# Patient Record
Sex: Male | Born: 1980 | Race: White | Hispanic: No | Marital: Single | Smoking: Never smoker
Health system: Southern US, Community
[De-identification: ages and names within clinical notes are randomized; demographics above are authoritative.]

## PROBLEM LIST (undated history)

## (undated) DIAGNOSIS — I10 Essential (primary) hypertension: Secondary | ICD-10-CM

## (undated) DIAGNOSIS — N186 End stage renal disease: Secondary | ICD-10-CM

## (undated) DIAGNOSIS — E119 Type 2 diabetes mellitus without complications: Secondary | ICD-10-CM

## (undated) DIAGNOSIS — Z951 Presence of aortocoronary bypass graft: Secondary | ICD-10-CM

## (undated) DIAGNOSIS — I2510 Atherosclerotic heart disease of native coronary artery without angina pectoris: Secondary | ICD-10-CM

## (undated) DIAGNOSIS — K219 Gastro-esophageal reflux disease without esophagitis: Secondary | ICD-10-CM

## (undated) DIAGNOSIS — Z992 Dependence on renal dialysis: Secondary | ICD-10-CM

---

## 2004-10-27 ENCOUNTER — Ambulatory Visit (HOSPITAL_COMMUNITY): Admission: RE | Admit: 2004-10-27 | Discharge: 2004-10-28 | Payer: Self-pay | Admitting: Ophthalmology

## 2005-06-21 ENCOUNTER — Inpatient Hospital Stay (HOSPITAL_COMMUNITY): Admission: EM | Admit: 2005-06-21 | Discharge: 2005-06-23 | Payer: Self-pay | Admitting: Internal Medicine

## 2005-06-23 ENCOUNTER — Emergency Department (HOSPITAL_COMMUNITY): Admission: EM | Admit: 2005-06-23 | Discharge: 2005-06-24 | Payer: Self-pay | Admitting: Emergency Medicine

## 2005-09-11 ENCOUNTER — Inpatient Hospital Stay (HOSPITAL_COMMUNITY): Admission: EM | Admit: 2005-09-11 | Discharge: 2005-09-14 | Payer: Self-pay | Admitting: Emergency Medicine

## 2005-09-12 ENCOUNTER — Encounter (INDEPENDENT_AMBULATORY_CARE_PROVIDER_SITE_OTHER): Payer: Self-pay | Admitting: Neurology

## 2005-09-12 ENCOUNTER — Encounter (INDEPENDENT_AMBULATORY_CARE_PROVIDER_SITE_OTHER): Payer: Self-pay | Admitting: Interventional Cardiology

## 2006-02-14 ENCOUNTER — Ambulatory Visit: Admission: RE | Admit: 2006-02-14 | Discharge: 2006-02-14 | Payer: Self-pay | Admitting: Vascular Surgery

## 2006-03-29 DIAGNOSIS — Z951 Presence of aortocoronary bypass graft: Secondary | ICD-10-CM

## 2006-03-29 HISTORY — DX: Presence of aortocoronary bypass graft: Z95.1

## 2006-08-05 ENCOUNTER — Emergency Department (HOSPITAL_COMMUNITY): Admission: EM | Admit: 2006-08-05 | Discharge: 2006-08-05 | Payer: Self-pay | Admitting: Emergency Medicine

## 2006-11-15 ENCOUNTER — Inpatient Hospital Stay (HOSPITAL_COMMUNITY): Admission: EM | Admit: 2006-11-15 | Discharge: 2006-11-17 | Payer: Self-pay | Admitting: Emergency Medicine

## 2006-11-25 ENCOUNTER — Inpatient Hospital Stay (HOSPITAL_COMMUNITY): Admission: EM | Admit: 2006-11-25 | Discharge: 2006-11-27 | Payer: Self-pay | Admitting: Emergency Medicine

## 2007-01-10 ENCOUNTER — Inpatient Hospital Stay (HOSPITAL_COMMUNITY): Admission: EM | Admit: 2007-01-10 | Discharge: 2007-01-12 | Payer: Self-pay | Admitting: *Deleted

## 2007-02-27 ENCOUNTER — Inpatient Hospital Stay (HOSPITAL_COMMUNITY): Admission: EM | Admit: 2007-02-27 | Discharge: 2007-03-03 | Payer: Self-pay | Admitting: Emergency Medicine

## 2007-02-27 ENCOUNTER — Ambulatory Visit: Payer: Self-pay | Admitting: Internal Medicine

## 2007-03-24 ENCOUNTER — Emergency Department (HOSPITAL_COMMUNITY): Admission: EM | Admit: 2007-03-24 | Discharge: 2007-03-24 | Payer: Self-pay | Admitting: Emergency Medicine

## 2007-09-09 ENCOUNTER — Inpatient Hospital Stay (HOSPITAL_COMMUNITY): Admission: EM | Admit: 2007-09-09 | Discharge: 2007-09-12 | Payer: Self-pay | Admitting: Emergency Medicine

## 2009-04-05 ENCOUNTER — Ambulatory Visit (HOSPITAL_COMMUNITY): Admission: EM | Admit: 2009-04-05 | Discharge: 2009-04-05 | Payer: Self-pay | Admitting: Vascular Surgery

## 2009-04-05 ENCOUNTER — Ambulatory Visit: Payer: Self-pay | Admitting: Vascular Surgery

## 2009-04-29 ENCOUNTER — Ambulatory Visit: Payer: Self-pay | Admitting: Vascular Surgery

## 2009-05-13 ENCOUNTER — Ambulatory Visit: Payer: Self-pay | Admitting: Vascular Surgery

## 2009-05-13 ENCOUNTER — Ambulatory Visit (HOSPITAL_COMMUNITY): Admission: RE | Admit: 2009-05-13 | Discharge: 2009-05-13 | Payer: Self-pay | Admitting: Vascular Surgery

## 2009-06-05 ENCOUNTER — Ambulatory Visit: Payer: Self-pay | Admitting: Vascular Surgery

## 2009-06-16 IMAGING — CR DG CHEST 1V PORT
1 series · 1 of 1 positions shown · non-contrast
Comparison: 02/14/2006

CLINICAL DATA: Missed dialysis. Headaches.

CHEST - 1 VIEW

[view not recorded]
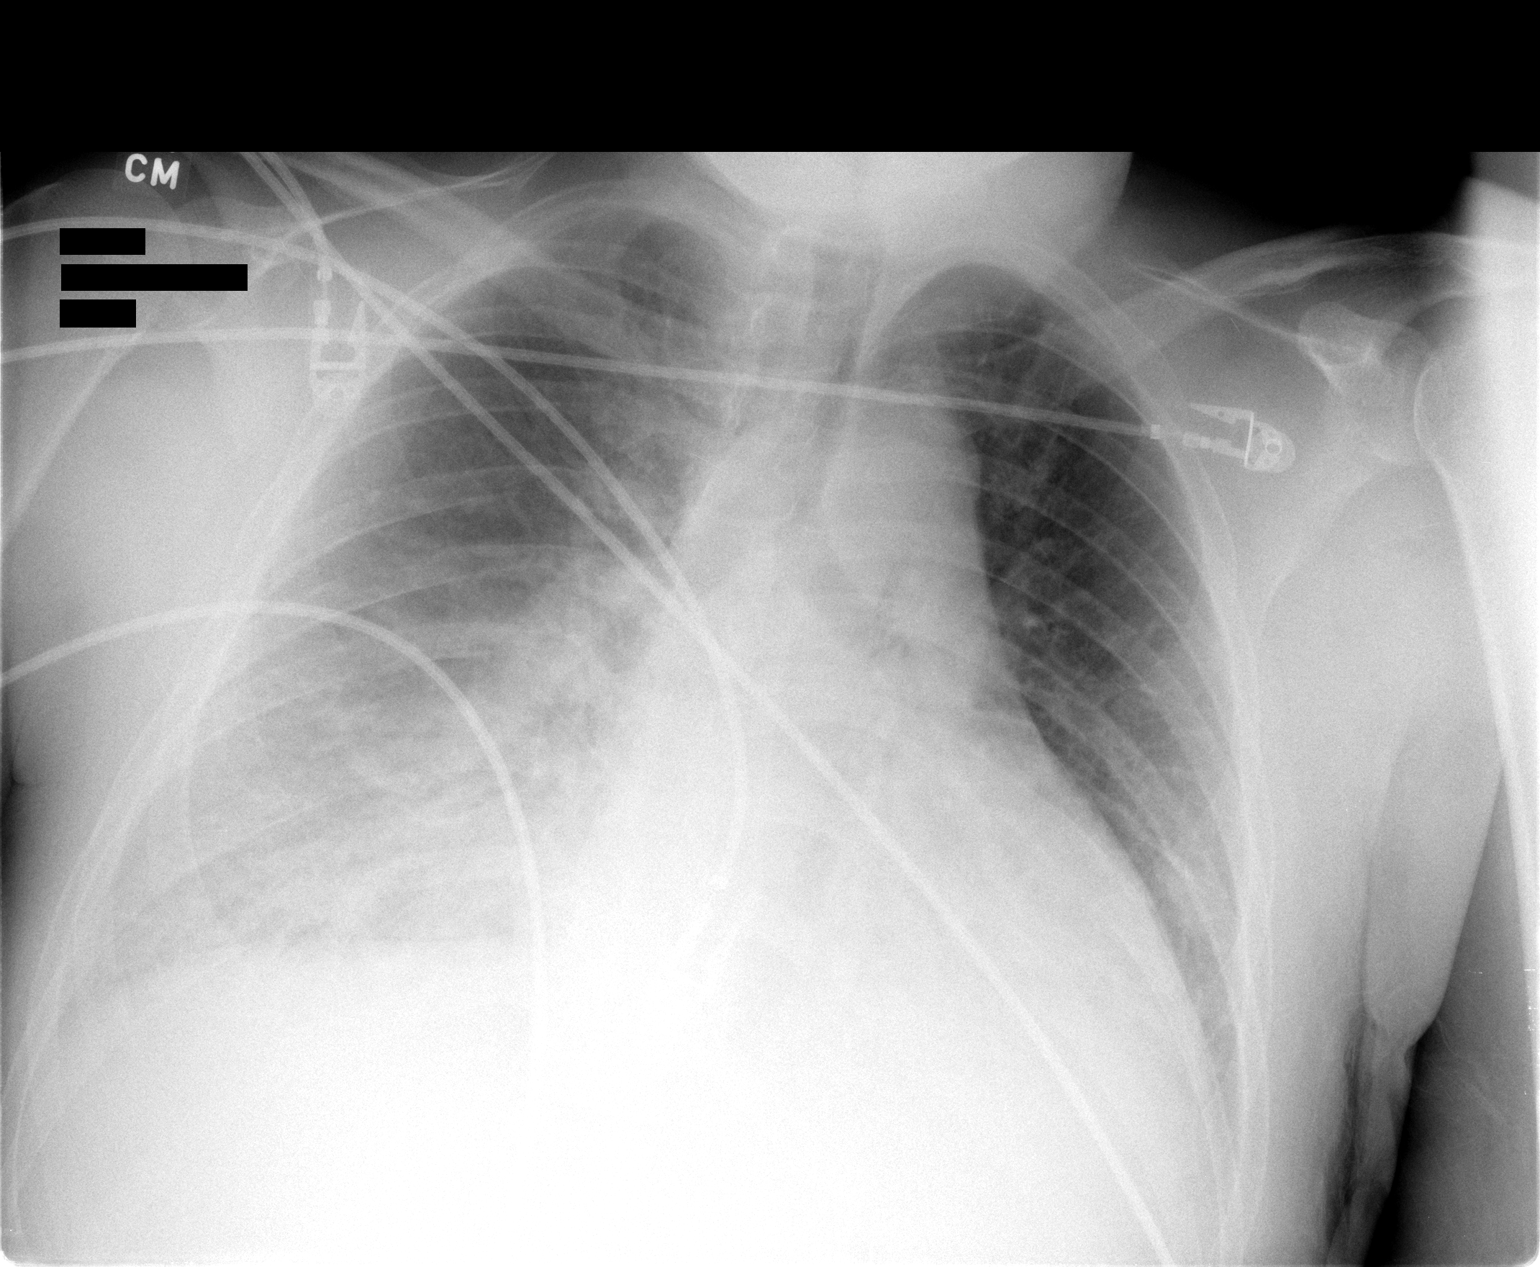

[1 of 1 positions shown; findings below may reference images not displayed]

FINDINGS: Midline trachea. Mild cardiomegaly. Small layering right-sided
pleural effusion. No pneumothorax. Mild interstitial edema. Right greater than
left bibasilar airspace disease.

IMPRESSION

1. Cardiomegaly with mild interstitial edema.
2. Right greater than left bibasilar air space opacities are likely asymmetric
alveolar edema. Infection felt less likely.
3. Small right-sided pleural effusion.

## 2009-06-17 ENCOUNTER — Ambulatory Visit (HOSPITAL_COMMUNITY): Admission: RE | Admit: 2009-06-17 | Discharge: 2009-06-17 | Payer: Self-pay | Admitting: Vascular Surgery

## 2010-06-14 LAB — POCT I-STAT, CHEM 8
Chloride: 105 mEq/L (ref 96–112)
Creatinine, Ser: 12.2 mg/dL — ABNORMAL HIGH (ref 0.4–1.5)
Glucose, Bld: 167 mg/dL — ABNORMAL HIGH (ref 70–99)
HCT: 35 % — ABNORMAL LOW (ref 39.0–52.0)
Hemoglobin: 11.9 g/dL — ABNORMAL LOW (ref 13.0–17.0)
Potassium: 5.9 mEq/L — ABNORMAL HIGH (ref 3.5–5.1)
Sodium: 134 mEq/L — ABNORMAL LOW (ref 135–145)

## 2010-06-14 LAB — GLUCOSE, CAPILLARY: Glucose-Capillary: 122 mg/dL — ABNORMAL HIGH (ref 70–99)

## 2010-06-17 LAB — GLUCOSE, CAPILLARY: Glucose-Capillary: 110 mg/dL — ABNORMAL HIGH (ref 70–99)

## 2010-06-17 LAB — POCT I-STAT 4, (NA,K, GLUC, HGB,HCT): Glucose, Bld: 144 mg/dL — ABNORMAL HIGH (ref 70–99)

## 2010-06-21 LAB — GLUCOSE, CAPILLARY: Glucose-Capillary: 142 mg/dL — ABNORMAL HIGH (ref 70–99)

## 2010-06-21 LAB — POCT I-STAT 4, (NA,K, GLUC, HGB,HCT)
Glucose, Bld: 153 mg/dL — ABNORMAL HIGH (ref 70–99)
HCT: 40 % (ref 39.0–52.0)
Potassium: 4.3 mEq/L (ref 3.5–5.1)

## 2010-08-11 NOTE — Discharge Summary (Signed)
[name], [name] NO.:  [id number]   MEDICAL RECORD NO.:  [id number]           PATIENT TYPE:   LOCATION:                                 FACILITY:   PHYSICIAN:  Maree Krabbe, M.D.DATE OF BIRTH:  01/28/1929   DATE OF ADMISSION:  11/25/2006  DATE OF DISCHARGE:  11/27/2006                               DISCHARGE SUMMARY   DISCHARGE DIAGNOSES:  1. End-stage renal disease on hemodialysis.  2. Diabetes.  3. Hypertension.  4. Hyperlipidemia.  5. Anemia.  6. Gastroesophageal reflux disease.  7. Secondary hyperparathyroidism.  8. History of migraines.   CONSULTS:  None.   PROCEDURES:  None.   DISCHARGE MEDICATIONS:  There have been no changes to the patient's  medications he was taking on admission with the exception of Lantus  being decreased to 5 units each night.  He will continue on glipizide 10  mg once daily. However, would consider stopping this if hypoglycemic  episodes continued to occur. Please see the dictated H and P for full  list of home medications.   HOSPITAL COURSE:  This is a 30 year old male with a history of end-stage  renal disease secondary to diabetes and hypertension with a recent  admission to the hospital.  He was admitted for altered mental status,  with staring and drooling spells.  His blood sugar in the field was  found to be around 70, and his mental status improved rapidly after  administration of oral glucose.  He says he has been having more  frequent hypoglycemic episodes over the past few days prior to admission  as well as decreased appetite.   1. Hypoglycemia:  The patient's glipizide and Lantus were held, and he      was started on sliding scale insulin.  His blood sugars were      stable, and he was therefore started on a decreased dose of Lantus      5 units each night and      continued to cover with sliding scale insulin.  He tolerated the      Lantus well and will be discharged on this decreased dose of  Lantus      as well as restarting glipizide 10 mg daily.  All of his other      problems remained stable throughout this hospitalization.      Sylvan Cheese, M.D.  Electronically Signed      Maree Krabbe, M.D.  Electronically Signed    MJ/MEDQ  D:  01/12/2007  T:  01/13/2007  Job:  [id number]

## 2010-08-11 NOTE — Procedures (Signed)
CEPHALIC VEIN MAPPING   INDICATION:  Preop evaluation for AV fistula placement.   HISTORY:  Chronic kidney disease.   EXAM:  The right cephalic vein is compressible with diameter  measurements ranging from 0.1 to 0.3 cm.   The right basilic vein is compressible with diameter measurements  ranging from 0.44 to 0.49 cm.   The left cephalic vein has been used for a previous AV fistula which is  now occluded.   The left basilic vein is compressible with diameter measurements ranging  from 0.47 to 0.59 cm.   See attached worksheet for all measurements.   IMPRESSION:  1. Patent right cephalic and bilateral basilic veins with diameter      measurements as described above and on the attached worksheet.  2. Occluded left brachial cephalic arteriovenous fistula noted.   ___________________________________________  Quita Skye Hart [place], M.D.   CH/MEDQ  D:  04/30/2009  T:  04/30/2009  Job:  [id number]

## 2010-08-11 NOTE — Consult Note (Signed)
[name], [name] NO.:  [id number]   MEDICAL RECORD NO.:  [id number]          PATIENT TYPE:  INP   LOCATION:  3108                         FACILITY:  [facility]   PHYSICIAN:  Marlan Palau, M.D.  DATE OF BIRTH:  [birth year]   DATE OF CONSULTATION:  02/28/2007  DATE OF DISCHARGE:                                 CONSULTATION   HISTORY OF PRESENT ILLNESS:  [name] is a 30 year old white male  born 11-16-1980 with a history of hypertension, end-stage renal  disease, and diabetes.  This patient has had events in the past  associated with some headache and altered mental status and has been  seen previously by Dr. Pearlean Brownie in October 2008.  The patient had speech  disturbance confusion and some reports of right-sided weakness off and  on over the last 2 years.  The patient has had an MRI scan of the brain  as recently as October 2008 that was unremarkable. MRI angiogram was  also done showing some intracranial atherosclerosis otherwise  unremarkable.  Mild atrophy is seen, very mild nonspecific white matter  changes were noted.  The patient has presented once again with an  episode of confusion that began around 1:00 p.m. on February 27, 2007.  The patient was noted to have blood pressures that were somewhat  elevated in the 200/120 range and had a nonfocal examination.  CT scan  of the brain on admission was once again unremarkable.  The patient has  continued to be somewhat confused, somewhat sleepy, at times agitated  during this admission.  Neurology was asked to see this patient for  further evaluation.   PAST MEDICAL HISTORY:  1. Altered mental status.  2. Severe hypertension.  3. End-stage renal disease.  4. History of possible migraine.  5. Diabetes.  6. Diabetic retinopathy.  7. Gastroparesis.  8. Secondary hyperparathyroidism.  9. History of depression and anxiety.  10.The patient has no known allergies and does not smoke or drink.   CURRENT  MEDICATIONS:  1. Aranesp 200 mcg IV on Tuesdays with hemodialysis.  2. Sliding scale insulin.  3. Iron complex 50 mg once a week.  4. Normodyne 300 mg b.i.d.  5. Zofran 4 mg if needed.  6. Protonix 80 mg daily.  7. Verapamil 240 mg q.h.s.  8. Tylenol if needed.  9. Ultram if needed.   SOCIAL HISTORY:  Notable that the lives in the [place] area, is  apparently engaged, is unemployed.   FAMILY MEDICAL HISTORY:  Unobtainable at this time but is notable for  diabetes in the mother from prior dictation summaries.   REVIEW OF SYSTEMS:  Difficult to obtain. The patient perseverates and  this does not offer good history.   Blood pressure currently is 163/90, heart rate 78 respiratory rate 17,  temperature afebrile.  GENERAL:  This patient is a fairly well-developed, white male who is  alert but is confused at the time of examination.  HEENT:  Head is atraumatic.  EYES:  Pupils equal, round and reactive to light.  Disks are flat  bilaterally.  NECK:  Supple.  No carotid bruits are noted.  RESPIRATORY:  Clear.  CARDIOVASCULAR:  Regular rate and rhythm.  No obvious murmurs or rubs  noted.  EXTREMITIES:  Without significant edema.  NEUROLOGIC:  Cranial nerves as above.  Facial symmetry is present.  The  patient appears to blink to threat bilaterally.  Counts fingers well  bilaterally. The patient will move all fours, has some mild asterixis  with the right upper extremity. The patient does not follow commands  very well to cooperate for cerebellar testing.  The patient is able to  lift both legs up off the bed, has fairly symmetric strength throughout.  Deep tendon reflexes depressed but symmetric.  Toes are neutral  bilaterally.  The patient could not be ambulated.   LABORATORY DATA:  Laboratory values notable for sodium 133, potassium  4.6, chloride of 88, bicarb 28, glucose of 122, BUN of 44, creatinine  11.24, hemoglobin 12.8, hematocrit 37.3, white count of 9.4, platelets  247,  MCV of 86.4, TSH is 2.774, CK level of 139.   CT of the head is as above.   IMPRESSION:  1. History of altered mental status.  2. Severe hypertension.  3. End-stage renal disease.   This patient has a nonfocal examination.  The patient has had episodes  of altered mental status in the past occasionally associated with  staring episodes. Will evaluate this patient for possible hypertensive  encephalopathy or seizure-type events.  Will proceed with further workup  at this point.   PLAN:  1. MRI scan of the brain.  2. EEG study.  3. Take blood work for B12 level, thiamine level.  4. Will follow the patient's clinical course while in-house.      Marlan Palau, M.D.  Electronically Signed     CKW/MEDQ  D:  02/28/2007  T:  02/28/2007  Job:  [id number]

## 2010-08-11 NOTE — Assessment & Plan Note (Signed)
OFFICE VISIT   [name], [name]  DOB:  05/04/1980                                       04/29/2009  OZHYQ#:[id number]   The patient has had a left upper arm fistula since 2007 which recently  thrombosed and is not revisable because of diffuse degeneration and  aneurysmal dilatation.  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  He is right-handed.  I ordered vein mapping  today and reviewed and interpreted this.  It appears that his right arm  cephalic vein may be adequate for a upper arm fistula but his right  forearm vein is too small.  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  Will schedule him for creation of the right  upper arm fistula on Tuesday, February 8, by Dr. Edilia Bo as an  outpatient.  He is also awaiting a kidney transplant at [facility], hopefully  will be done in the not-too-distant future.     Quita Skye Hart [place], M.D.  Electronically Signed   JDL/MEDQ  D:  04/29/2009  T:  04/30/2009  Job:  [id number]

## 2010-08-11 NOTE — Discharge Summary (Signed)
NAMEJAYMOND, [name] NO.:  [id number]   MEDICAL RECORD NO.:  [id number]          PATIENT TYPE:  INP   LOCATION:  6707                         FACILITY:  [facility]   PHYSICIAN:  Zenaida Deed. Mayford Knife, M.D.DATE OF BIRTH:  [birth year]   DATE OF ADMISSION:  02/27/2007  DATE OF DISCHARGE:  03/03/2007                               DISCHARGE SUMMARY   ADMISSION DIAGNOSIS:  A 30 year old white male with end-stage renal  disease on hemodialysis, admitted for acute altered mental status.   SIGNIFICANT FINDINGS:  On admission, the patient had a CBC within normal  limits and electrolytes were reasonably within normal limits.  Head CT  was normal with no movement.  Myoglobin was noted to be elevated on  admission; however, this was thought to be secondary to his end-stage  renal disease.  Ammonia level was found to be 17.  The patient's serum  was negative for alcohol.  TSH was found to be 2.77, phosphorus was 8.2,  hemoglobin A1c 5.5, vitamin B12 greater than 2000, and folate greater  than 20.  Drugs of abuse screen was negative except for barbiturates.  On the day of discharge, CBC showed a white blood cell count of 5.2,  hemoglobin 11.6, and platelet count 206.  Renal function panel showed  sodium 128, potassium 4.5, chloride 93, CO2 25, glucose 135, BUN 36,  creatinine 7.02, GFR of 9, albumin 2.9, calcium 9.6, and phosphorus 5.4.  Mini-mental status exam performed during admission was 22 out of 30.  MRI performed on February 28, 2007, showed no evidence of acute  intracranial abnormality.  There was a stable but generalized volume  loss advanced for age and mild periventricular white matter signal  abnormality.  These changes were noted to be nonspecific.  Abdominal CT  performed on March 02, 2007, showed no acute pelvic process,  significant distention of the bladder, calcification of the vas deferens  consistent with advanced diabetes and diffuse vascular calcifications as  well.   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  The patient was admitted to the hospital and workup was  generally found to be negative for chemical imbalance including the  negative drug screen and negative electrolyte abnormalities.  The  patient's MRA and CT scan were within normal limits and no further  neurologic workup was pursued.  The patient's mental status slowly  improved, and by hospital day #2, the patient was generally lucid and  unable to be appropriate and work with exam.  There were intervals where  the patient did seem to zone out during conservations; however, for  the most part he remained appropriate from a psychiatric standpoint.  Mini-mental status exam was performed by a fourth-year medical student  and found to be 22 out of 30, which was a little bit surprising given  his level of appropriateness with our interactions.  However, this was  taken into account during his hospital stay.  While in the hospital, the  patient had some difficulty with increased blood pressures.  His blood  pressure medicines were adjusted from a home dose of labetalol 300 mg  b.i.d. ultimately to labetalol 600 mg 3 times a day at the time of  discharge.  The patient was also transitioned from his home Tarka to  [facility] 8 mg daily and amlodipine 10 mg daily.  The patient had a good  response to this treatment, and at the time of discharge, on March 03, 2007, his vital signs were stable with blood pressure ranging from 130s  to 150s/70s to one-teens systolic.  The patient has had chronic  difficulty with blood pressure control and was noticeably improved  during his hospital admission.  The patient was transitioned to his  regular  hemodialysis scheduled while in the hospital, which is every  Monday, Wednesday, and Friday, and received hemodialysis prior to  discharge on the date of discharge Friday, March 03, 2007.  At the  date of discharge, the patient was agreeable, alert, interactive, and  appropriate with interview and exam.  He expressed a desire to return  home and was found to be clinically stable to do so.  The patient has  regular renal followup given his end-stage renal disease with regular  hemodialysis session scheduled as well.  The patient was discharged on  March 03, 2007, to home in stable condition.   DISCHARGE MEDICATIONS:  1. Klonopin 0.5 mg once at night.  2. PhosLo 667 mg 3 times a day before meals.  3. Reglan 10 mg 3 times a day before meals.  4. Nephro-Vite once daily.  5. Restoril 30 mg once at night.  6. Ultram 50 mg every 6 hours as needed for pain.  7. Lantus 5 units at night.   DISCONTINUE MEDICATIONS:  The patient is to stop taking Tarka.   NEW MEDICATIONS:  1. The patient should start taking Mavik 8 mg once daily, prescription      given.  2. Amlodipine 10 mg once daily, prescription given.   CHANGED MEDICATIONS:  The patient's labetalol dose was increased from  300 mg b.i.d. to 600 mg t.i.d.   DISCHARGE INSTRUCTIONS:  1. The patient is to take medications as described previously.  2. The patient is to follow up with his regular hemodialysis      appointments Monday, Wednesday, and Friday.  3. The patient is to follow up with his nephrologist as directed by      his nephrologist.   DISCHARGE CONDITION:  Stable, fair.   DISCHARGE DIAGNOSES:  1. Altered mental status now resolved, thought to be secondary to      delirium secondary to uremia from end-stage renal disease versus      hypertensive encephalopathy versus atypical migraines.  2. End-stage renal disease secondary to diabetes mellitus.  3. Diabetes mellitus type 2 for 15 years.  4. History of migraines.  5.  History of gastroesophageal reflux disease.  6. History of hypertension.  7. Depression and anxiety.  8. Anemia secondary to end-stage renal disease.  9. Secondary hyperparathyroidism.  10.Hyperlipidemia.  11.Questionable history of diabetic gastroparesis.   DISPOSITION:  The patient was discharged to home with routine followup  with [facility] and his nephrologist.      Myrtie Soman, MD  Electronically Signed      Zenaida Deed. Mayford Knife, M.D.  Electronically Signed    TE/MEDQ  D:  03/03/2007  T:  03/04/2007  Job:  [id number]   cc:   Terrial Rhodes, M.D.  Bevelyn Buckles. Nash Shearer, M.D.

## 2010-08-11 NOTE — Assessment & Plan Note (Signed)
OFFICE VISIT   [name], [name]  DOB:  [birth year]                                       06/05/2009  ZOXWR#:[id number]   I saw the patient in the office today in followup after recent placement  of a right forearm AV graft on 05/13/2009.  He comes in for a routine  wound check.   On examination his incisions are healing nicely.  His graft has an  excellent thrill and he had a palpable radial pulse.   I think it is okay to use his graft starting next week.  He would also  like to have the aneurysmal left upper arm AV fistula excised as this  has been bothering him.  We will try to arrange this on a Tuesday and we  can remove his catheter at the same time on 05/20/2009.  His father is  going to check to make sure he can give him a ride that day.     Di Kindle. Edilia Bo, M.D.  Electronically Signed   CSD/MEDQ  D:  06/05/2009  T:  06/06/2009  Job:  [id number]

## 2010-08-11 NOTE — Discharge Summary (Signed)
[name], [name]                 ACCOUNT NO.:  [id number]   MEDICAL RECORD NO.:  [id number]          PATIENT TYPE:  INP   LOCATION:  6715                         FACILITY:  [facility]   PHYSICIAN:  Cecille Aver, M.D.DATE OF BIRTH:  07/02/1980   DATE OF ADMISSION:  09/09/2007  DATE OF DISCHARGE:  09/12/2007                               DISCHARGE SUMMARY   DISCHARGE DIAGNOSES:  1. Malignant hypertension with hypertensive encephalopathy, resolved.  2. Nausea and vomiting, resolved.  3. Hyperkalemia, resolved.  4. Diabetes mellitus.  5. Anemia.  6. Secondary hyperparathyroidism.  7. Coronary artery disease status post coronary artery bypass graft      with a history of coronary artery bypass graft.  8. End-stage renal disease.   PROCEDURES:  September 10, 2007, head CT without contrast.   IMPRESSION:  Small old left frontoparietal periventricular white matter  infarct that is new since December 2008, no acute intracranial  abnormalities.   HISTORY OF PRESENT ILLNESS:  This is a 30 year old Caucasian male with  end-stage renal disease secondary to type 1 diabetes mellitus.  He  requires chronic hemodialysis at the [facility] on Tuesday,  Thursdays, and Saturdays.  His last hemodialysis treatment was as a September 06, 2007.  He presented to the emergency room complaining of nausea and  vomiting for the past few days.  He was too sick to get a hemodialysis.  He does also complain of headache upon presentation to the emergency  room.  In the emergency room, his potassium was found to be 6.5 and  glucose 134.  The patient was admitted for further evaluation, IV  medications, and emergent hemodialysis.   DIAGNOSTIC RADIOLOGICAL EXAMINATIONS:  September 09, 2007, one-view chest x-  ray, impression left lower lobe atelectasis and moderate cardiomegaly.   ADMISSION LABORATORY DATA:  WBC 7.3, hemoglobin 12.7, hematocrit 38.0,  platelet 166.  Sodium 127, potassium 6.5, chloride 66,  glucose 134, BUN  64, creatinine 11.6.  Preliminary blood culture results, no growth to  date.   HOSPITAL COURSE:  1. Malignant hypertension with hypertensive encephalopathy, resolved      at the time of discharge and the patient was admitted.  He was      originally continued on his oral labetalol and Norvasc therapy.  He      received emergent hemodialysis; however, during dialysis, he had an      episode of altered mental status with hypertension continuing.  He      underwent a head CT scan with results as described above.  He was      transferred to the Intensive Care Unit and was placed on a      nitroglycerin drip originally which was transitioned to Nipride      drip.  The Nipride drip was originally was eventually discontinued.      The patient was placed on a labetalol drip.  He was gradually      transitioned to all oral antihypertensive medications and      transferred out of the Intensive Care Unit as his blood pressure  became better controlled.  His altered mental status did resolve      completely and prior to discharge his systolic blood pressure      ranged from the 150s to 170s, diastolic ranged from the 90s to low      100s and pulse rate ranged from the 70s to 80s.  His blood pressure      medication will continue to be titrated as needed and will be      followed closely at the outpatient hemodialysis center.  2. Nausea and vomiting resolved.  Upon admission, the patient had      episodes of nausea, vomiting felt likely to be secondary to      diabetic related gastroparesis as well as malignant hypertension.      Blood cultures were collected empirically which are in no growth to      date at the time of discharge.  He was continued on Reglan therapy      and proton pump inhibitor therapy throughout his hospitalization      and nausea, vomiting had resolved completely by the time of      discharge.  3. Hyperkalemia.  At the time of admission, the patient did  receive      emergent hemodialysis on a decreased potassium bath and      hyperkalemia did resolve and at the time of discharge his potassium      was 4.3.  4. Diabetes mellitus.  The patient was continued on sliding scale      insulin with Accu-Cheks before meals  and at bedtime.  He also      continue his outpatient regimen of Lantus therapy.  He was placed      on a carbohydrate modified diet with his mentation improved.  Blood      glucose levels remained very well-controlled during his stay and      prior to discharge, his blood please glucose level was 112.  5. Anemia.  The patient continued his outpatient regimen of Epogen and      iron therapy throughout his stay.  Hemoglobin levels remained      stable and he remained without active signs of bleeding.  Prior to      discharge, the patient's hemoglobin was 12.5, and hematocrit 36.0.  6. Secondary hyperparathyroidism.  The patient continued his      outpatient regimen of vitamin D and phosphate binder therapy prior      to his outpatient regimen of vitamin D and phosphate binder therapy      and prior to discharge his calcium levels 9.1 and phosphorus had      decreased to 7.4.  We will continue to follow this levels at the      outpatient hemodialysis center may medication adjustment as      appropriate.  7. Coronary artery disease with history of CABG.  The patient      continued his aspirin therapy throughout his stay as well as his      statin therapy which he tolerated well without difficulty.  8. End-stage renal disease.  The patient required a emergent      hemodialysis at the time of admission as described above.  He      otherwise continued hemodialysis throughout the remainder of his      hospitalization via a left upper extremity AV fistula.  Average      blood flow rate was 400.  Average ultrafiltration was 3-1/2 liters  and heart rate ranged from the 70s to 90s.  During his inpatient      hemodialysis treatment,  systolic blood pressure ranged from the      120s to 170s   DISCHARGE MEDICATIONS:  1. Dyazide 1 p.o. daily.  2. Renagel 800 mg 2 tablets p.o. t.i.d. with meals.  3. Aspirin 81 mg daily.  4. Labetalol 400 mg p.o. b.i.d. hold a.m. on a dialysis days.  5. Norvasc 10 mg p.o. nightly.  6. Omeprazole 40 mg p.o. nightly.  7. Lantus 12 units subcu nightly.  8. Pravastatin 40 mg p.o. q. Evening.  9. Micardis 1 p.o. b.i.d. hold mornings of dialysis days.  10.Metoclopramide 10 mg p.o. before every meal and at bedtime.   HEMODIALYSIS MEDICATIONS:  Epogen 28,000 units IV q. hemodialysis  treatment,  Zemplar 2 mcg IV q. hemodialysis treatment, and Venofer 100  mg IV q. week with hemodialysis.   DISCHARGE INSTRUCTIONS:  The patient is to resume a renal diabetic diet  with a 1200 mL fluid restriction.  He is to return to his outpatient  hemodialysis center as scheduled prior to admission.  next hemodialysis.   INSTRUCTIONS:  The patient's estimated dry weight has been decreased to  67.5 kg.  Please follow up on final blood culture results from September 09, 2007.  Please note it took approximately 45 minutes to complete this  discharge.      Tracey P. Sherrod, NP    ______________________________  Cecille Aver, M.D.    TPS/MEDQ  D:  09/12/2007  T:  09/13/2007  Job:  [id number]   cc:   [facility]

## 2010-08-11 NOTE — Procedures (Signed)
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   PROCEDURE:  1. Removal of left IJ Palindrome catheter.  2. Excision of 2 large aneurysms of left upper arm AV fistula.   SURGEON:  Di Kindle. Edilia Bo, M.D.   ASSISTANT:  Della Goo and Iva Boop, [place].   ANESTHESIA:  MAC   TECHNIQUE:  The patient was taken to the operating room and sedated by  anesthesia.  Under local anesthesia the skin around the old left IJ  catheter was anesthetized.  The cuff was dissected free using blunt  dissection and then the catheter removed and pressure held for  hemostasis.  No immediate complications were noted.  A sterile dressing  was applied.   Next attention was turned to excision of 2 large aneurysms of the left  upper arm AV fistula.  Two elliptical incisions were marked to encompass  these large aneurysms leaving a bridge between the 2 areas.  Using a  generous amount of local anesthesia these areas were anesthetized and  then the incisions were made in the aneurysm was dissected free.  The  fistula was chronically occluded and I dissected down to the sclerotic  area of the vein which was then divided and there was really no  connection to the artery.  The aneurysm was then excised and hemostasis  obtained using electrocautery.  The smaller aneurysm had been marked and  this incision was made and this aneurysm was dissected free and then the  proximal vein oversewn with a 5-0 Prolene.  Hemostasis was obtained in  this wound.  Both wounds were closed with a deep layer of 3-0 Vicryl.  The skin closed with 4-0 Vicryl.  Sterile dressing was applied.  The  patient  tolerated the procedure well and was transferred to the recovery  room in stable condition.  All needle and sponge counts were correct.   Di Kindle. Edilia Bo, M.D.  Electronically Signed   CSD/MEDQ  D:  06/17/2009  T:  06/17/2009  Job:  [id number]

## 2010-08-11 NOTE — Discharge Summary (Signed)
NAMEACETON, KINNEAR NO.:  [id number]   MEDICAL RECORD NO.:  [id number]          PATIENT TYPE:  INP   LOCATION:  3735                         FACILITY:  [facility]   PHYSICIAN:  Terrial Rhodes, M.D.DATE OF BIRTH:  1981-02-17   DATE OF ADMISSION:  11/15/2006  DATE OF DISCHARGE:  11/17/2006                               DISCHARGE SUMMARY   DISCHARGE SUMMARY ADDENDUM   Discharge summary addendum:  The patient was kept in the hospital for an  additional evening, prior to discharge for return of the patient's  migraine headache, as well as nausea and vomiting.  Oxycodone 10 mg p.o.  every 6 hours as needed for pain was added to the patient's regimen and  achieved good control of patient's headaches, with resolution of nausea  and vomiting by the following morning.  The patient was able to tolerate  breakfast, prior to discharge.  The patient was given a small  prescription for oxycodone 5 mg tablets, dispensed 10 new refills of the  time of discharge.  The patient's dialysis center was re-contacted with  updates and advised to arrange for the patient to see neurology in the  outpatient setting for consultation for migraines.   The patient was discharged to home in stable medical condition.  Please  see full discharge summary dictation for further details.      Drue Dun, M.D.  Electronically Signed     ______________________________  Terrial Rhodes, M.D.    EE/MEDQ  D:  11/23/2006  T:  11/24/2006  Job:  [id number]

## 2010-08-11 NOTE — Consult Note (Signed)
[name], [name]                 ACCOUNT NO.:  [id number]   MEDICAL RECORD NO.:  [id number]          PATIENT TYPE:  INP   LOCATION:  5508                         FACILITY:  [facility]   PHYSICIAN:  Pramod P. Pearlean Brownie, MD    DATE OF BIRTH:  [birth year]   DATE OF CONSULTATION:  DATE OF DISCHARGE:                                 CONSULTATION   REFERRING PHYSICIAN:  James L. Deterding, M.D.   REASON FOR REFERRAL:  Headaches, altered mental status and speech  disturbance.   HISTORY OF PRESENT ILLNESS:  Mr. Fusselman is a 30 year old Caucasian male  with end-stage renal disease, juvenile diabetes, hypertension, a long  history of hemodialysis, who has had recurrent episodes of headaches  with speech disturbance and confusion with right-sided weakness for the  last 2 years.  He states that the episodes last a couple of hours and  have been occurring with increased frequency and now once a week or so  for the last several months.  He has no specific triggers for the  episode.  It begins with initially a dull headache in the vertex, with  subsequently develops over half an hour to a severe throbbing headache,  which is 10/10 in severity, accompanied by nausea, vomiting, light and  sound sensitivity.  This is disabling.  He has to lie down.  He notices  that his speech becomes slurred and is not clear.  At times he has been  confused during the episode as well.  He has also noticed right-sided  weakness.  This lasts a couple of hours and recovers completely when the  headache is gone.  He has seen a neurologist in [place] a year ago and  was told these were migraines and was given treatment for pain  medication.  He has been taking some Percocet which, has helps but makes  him sleepy and drowsy.  His focal neurological symptoms resolved when  the headache is gone.  He denies prior history suggestive of migraines  in early childhood.  There is no family history of complicated migraine  or even  migraines.  He had an MRI scan and an MRA of the brain done in  June of last year, which are both normal.   PAST MEDICAL HISTORY:  1. End-stage renal disease on hemodialysis.  2. Cardiomegaly.  3. Migraines.  4. Gastroparesis.  5. Diabetic retinopathy.  6. Hyperlipidemia.  7. Anemia.  8. Hypertension.  9. Secondary hyperparathyroidism.   CURRENT MEDICATIONS:  Ceftazidime 1 g, sliding scale insulin, Reglan,  Protonix, vancomycin. PhosLo, Klonopin, labetalol p.r.n.   ALLERGIES TO MEDICATIONS:  None known.   SOCIAL HISTORY:  The patient lives in [place] area and is engaged to be  married.  He is presently unemployed.  He does not smoke or drink.   FAMILY HISTORY:  Not significant for migraines.   REVIEW OF SYSTEMS:  Positive for nausea, vomiting, headaches, tiredness,  increased frequency of urine.  Generalized weakness, blurred vision.   PHYSICAL EXAM:  A young Caucasian male who is presently not in distress.  He is getting hemodialysis.  His  temperature 97.8, blood pressure 158/104, pulse rate 96 per minute,  respiratory rate 20 per minute, saturations 98% on room air.  There is  1+ pedal edema.  His face is puffy.  Head is nontraumatic.  NECK:  Supple.  CARDIAC:  Regular heart sounds.  NEUROLOGIC:  He is pleasant, awake, alert, cooperative, with no aphasia,  apraxia or dysarthria.  He has clear speech.  Eye movements are full-  range.  There is no nystagmus.  Visual fields are full to confrontation  testing.  Face is symmetric.  Palatal movements normal.  Tongue is  midline.  Motor system exam reveals no upper extremity drift, symmetric  strength, tone, reflexes.  Plantars are downgoing.  Sensation is intact.  Gait was not tested.   DATA REVIEWED:  MRI scan of the brain done yesterday is normal without  any structural abnormalities.  MRI scan of the brain and MRA of the  brain done in June 2007 were also reviewed and are normal.  There is no  intracranial aneurysm, AV  malformation of stenosis seen.   IMPRESSION:  A 30-year gentleman with recurrent stereotypical episodes  of headaches, speech disturbance and right-sided weakness and confusion,  likely due to complicated migraine episodes.   PLAN:  I would recommend a trial of calcium channel blockers for  prophylaxis as the episodes are quite frequent.  Begin verapamil SR 120  mg a day.  He can use Ultram 100 mg q.6h. p.r.n. for symptomatic relief  of his headaches.  I have also advised him to pay close attention to  possible triggers for these spells to be avoided.   Kindly call for questions.           ______________________________  Sunny Schlein. Pearlean Brownie, MD     PPS/MEDQ  D:  01/11/2007  T:  01/12/2007  Job:  [id number]

## 2010-08-11 NOTE — H&P (Signed)
[name], [name]                 ACCOUNT NO.:  [id number]   MEDICAL RECORD NO.:  [id number]          PATIENT TYPE:  INP   LOCATION:  3316                         FACILITY:  [facility]   PHYSICIAN:  James L. Deterding, M.D.DATE OF BIRTH:  May 19, 1980   DATE OF ADMISSION:  01/10/2007  DATE OF DISCHARGE:                              HISTORY & PHYSICAL   REASON FOR ADMISSION:  Congestive heart failure.   SECONDARY DIAGNOSIS:  Aphasia with altered mental status.   HISTORY OF PRESENT ILLNESS:  This is a 30 year old gentleman with end-  stage renal disease, secondary to diabetes mellitus, long-term history  of hypertension, history of gastroparesis, history of anemia,  hyperthyroidism.  He also has had a lot of nonadherence of his dialysis  having missed dialysis today, the patient was not feeling too good so he  just did not come.  This has been a problem off and on with him missing  dialysis and he signs off dialysis very frequently, recently, apparently  was not able to talk as well and feeling bad.  He was more short of  breath and his speech became worse there.  He had complained of severe  headache, nausea and vomiting in the [facility] ER.  He was treated with  pain control.  Attempts at blood pressure control, chest x-ray shows  significant heart failure.  He had a CT of his brain which was negative.  He was transferred here for dialysis and management.   He has a history of migraines which frequently are associated with  severe nausea and vomiting as well as the severe headache.  Variable  neurologic symptoms during this time to which he has been worked up for  this in the past.  This kind of started yesterday.  He missed his  dialysis treatment.  He had a number of things going on with his diet  over the weekend.   PAST MEDICAL HISTORY:  Included the above illnesses.  He has also had  severe gastroparesis and proliferative diabetic retinopathy.   MEDICATIONS:  At home include:  1.  Labetalol 300 mg b.i.d.  2. RenaTab with iron once a day.  3. Klonopin 0.5 mg h.s. and pre dialysis.  4. Lantus 5 units h.s. subcu.  5. Oxycodone 5 mg p.r.n.  6. PhosLo 667 mg with meals t.i.d.  7. Reglan 10 mg a.c, and h.s.  8. Restoril 30 mg h.s. p.r.n.  9. Tarka 40/240 h.s.  10.Phenergan 25 mg q.6h. p.r.n.  11.He uses __________ prior to his cannulation.   ALLERGIES:  HE HAS NO KNOWN DRUG ALLERGIES.   SOCIAL HISTORY:  Lives in [place] area and he is engaged.  He  has a small child.  History of tobacco abuse in the past, none recently.  No alcohol or drug abuse.   FAMILY HISTORY:  Significant for mother with diabetes, otherwise  unremarkable.   REVIEW OF SYSTEMS:  Not obtainable at this time.   OBJECTIVE:  VITAL SIGNS:  Temperature is 100.1, blood pressure 168/98,  respiratory rate 20, 98% saturation on 2 liters on the day before.  GENERAL:  He is moaning and has asterixis.  HEENT:  Reveals significant diabetic retinopathy status post laser  therapy.  Pharynx unremarkable.  Ears unremarkable.  NECK:  Without masses or thyromegaly.  LUNGS:  Reveal crackles in both bases.  Midlung decreased expansion.  Decreased resonance to percussion.  CARDIOVASCULAR:  Regular rhythm __________ times 4.  Grade 1 to 2 over  6, course systolic ejection murmur left upper left  sternal border.  PMI  __________  left ventricular lift.  AV fistula left upper arm with a  bruit.  ABDOMEN:  Positive bowel sounds.  Soft.  Liver is down 2 to 3 cm.  SKIN:  Reveals to be slight pale, has doughy skin.  No significant  adenopathy or supraclavicular adenopathy.  MUSCULOSKELETAL:  No significant deficits.  NEUROLOGIC:  He is moaning at the asterixis.  He can say some words when  he is stimulated with pain but does not say them when prompted with  verbal stimulus.  He obeys some commands by squeezing hands and will  move his head side-to-side.  Strength in the right upper extremity if  4/5, left is  5/5.  Lower extremity strength is 5/5 and symmetrical.  He  responds only to pain  and will move those on command.  Reflexes are  2+/4+ in the upper extremities and symmetrical, 2+ in the patellar and  symmetrical, 1+ in the Achilles and symmetrical.  Toes are downgoing.   X-RAYS:  Chest x-ray shows cardiomegaly with an left effusion with  interstitial edema.   ASSESSMENT:  1. End-stage renal disease with __________  congestive heart failure      and hypertension associated with __________ needs dialysis.  This      has been an issue of adherence is a big problem for him.  This      could lead to morbidity and mortality for him.  We discussed this      with his parents in his presence.  We will try to dialyze him and      get his potassium down.  2. Congestive heart failure secondary to #1.  3. Hypertension.  Need to decrease his volume,  get him back on his      medications.  4. Diabetes mellitus, diet controlled.  5. Gastroparesis.  6. Severe nonadherent to his chronic problem.  7. Secondary hyperthyroidism.  8. Aphasia with right arm weakness with the aphasia, with CT has been      negative.  We will an MRI, question this could be migraine alone      versus stroke.   PLAN:  1. Hemodialysis.  2. MRI.  3. Glucose control.  4. Blood pressure control.  5. Counsel.           ______________________________  Llana Aliment. Deterding, M.D.     JLD/MEDQ  D:  01/10/2007  T:  01/10/2007  Job:  [id number]   cc:   [facility]

## 2010-08-11 NOTE — Discharge Summary (Signed)
NAMETYMEL, CONELY NO.:  [id number]   MEDICAL RECORD NO.:  [id number]          PATIENT TYPE:  INP   LOCATION:  3735                         FACILITY:  [facility]   PHYSICIAN:  Terrial Rhodes, M.D.DATE OF BIRTH:  Mar 05, 1981   DATE OF ADMISSION:  11/15/2006  DATE OF DISCHARGE:  11/16/2006                               DISCHARGE SUMMARY   DISCHARGE DIAGNOSES:  1. Intractable nausea and vomiting secondary to migraine, improved.  2. End-stage renal disease on hemodialysis, secondary to diabetes and      hypertension.  3. Type 2 diabetes.  4. Hypertension.  5. Anemia.  6  Secondary hyperparathyroidism.  7  Gastroesophageal reflux disease.  8  History of migraines.   DISCHARGE MEDICATIONS:  1. Tarka 4/240 mg 1 tablet p.o. q.h.s. (this is an increased dose).  2. Nephro-Vite 1 tablet p.o. daily.  3. Clonazepam 0.5 mg p.o. q.h.s. as needed for leg cramps and prior to      dialysis as needed.  Do not drive after taking.  4. Glipizide 10 mg p.o. daily with food.  5. Lantus 13 units subcutaneously q.h.s.  6. Reglan 10 mg p.o. b.i.d. with meals.  7. PhosLo 667 mg 1 tablet p.o. t.i.d. with meals.  8. Restoril 30 mg p.o. q.h.s. as needed for sleep.  9. Phenergan 25 mg suppositories, one per rectum every 6 hours as      needed for nausea and vomiting.   The patient is to stop taking labetalol, sodium bicarbonate and Lasix.   CONSULTATIONS:  None.   PROCEDURES:  None.   PERTINENT LABORATORY DATA:  Hemoglobin 11.0, potassium 3.9, calcium 9.4.   CHANGES TO HEMODIALYSIS ORDERS AT THE TIME OF DISCHARGE:  The patient's  duration was increased to 4 hours and 15 minutes.  Estimated dry weight  is unchanged at 85.5 kg.  Epogen is increased to 20,000 units in view of  enzyme doses remaining the same.   BRIEF HOSPITAL COURSE:  This is a 30 year old white male with end-stage  renal disease, secondary to diabetes and hypertension.  Recently  initiated on hemodialysis this  year.  He presents with 1-2 days of  intractable nausea and vomiting secondary to migraine.   PROBLEM #1 - MIGRAINE:  The patient was treated symptomatically with  antiemetics in the emergency department, with some symptomatic relief  prior to being seen.  These were continued while the patient remained in  the hospital; and the patient was given something to help with sleep as  well.  On the morning following admission, the patient's migraine and  nausea and vomiting were completely resolved.  The patient was able to  tolerate a regular breakfast prior to hemodialysis and without any  subsequent nausea or vomiting prior to discharge.   PROBLEM #2 -  END-STAGE RENAL DISEASE ON HEMODIALYSIS.  The patient  normally received dialysis on Monday, Wednesday and Fridays.  The  patient was dialyzed on Wednesday prior to discharge.  Changes in the  hemodialysis regimen were made as above.   PROBLEM #3 - ANEMIA:  The patient's Epogen dose was increased to  20,000  units q. hemodialysis.  The patient was continued on __________ 50 mg IV  weekly with hemodialysis.   PROBLEM #4 -  SECONDARY HYPERPARATHYROIDISM:  The patient was continued  on PhosLo and Zemplar 6 mcg q. hemodialysis.   PROBLEM #5 - HYPERTENSION:  The patient's Tarka was increased to 1  tablet p.o. q.h.s. and labetalol was discontinued, as increased dose of  verapamil was felt to likely to be beneficial preventing further patient  migraines.  The patient's blood pressure remained fairly well controlled  throughout hospitalization.   PROBLEM #6 - GASTROESOPHAGEAL REFLUX DISEASE.  The patient was continued  on PPI.   DISCHARGE INSTRUCTIONS:  The patient is to follow a diabetic renal diet  and increase activity as tolerated.  The patient is to continue on his  normal Monday, Wednesday and Friday hemodialysis schedule.   The patient's dialysis center was contacted on the day of discharge, and  informed of the hospitalization and  changes to the patient's dialysis  and discharge medication regimen.   The patient was discharged home in stable medical condition.      Drue Dun, M.D.  Electronically Signed     ______________________________  Terrial Rhodes, M.D.    EE/MEDQ  D:  11/16/2006  T:  11/17/2006  Job:  [id number]

## 2010-08-11 NOTE — H&P (Signed)
[name], PRIME NO.:  [id number]   MEDICAL RECORD NO.:  [id number]          PATIENT TYPE:  INP   LOCATION:  3108                         FACILITY:  [facility]   PHYSICIAN:  Angeline Slim, M.D.  DATE OF BIRTH:  03/01/1981   DATE OF ADMISSION:  02/27/2007  DATE OF DISCHARGE:                              HISTORY & PHYSICAL   CHIEF COMPLAINT:  Altered mental status.   HISTORY OF PRESENT ILLNESS:  A 30 year old white male with acute altered  mental status that developed today just prior to hemodialysis at around  noon to 1:00.  He had a similar episode that occurred back in October of  this year, which was eventually diagnosed as atypical migraine per Dr.  Pearlean Gaines in [facility], although this episode seems to be  more severe per his fiance and mother.  This episode was not really  preceded by a headache at the time.  The rest of the history is limited  secondary to his uncooperative state, although the patient is not  necessarily flagrantly aggressive.   REVIEW OF SYSTEMS:  Essentially unable to obtain.  Per the wife, patient  has chronic vomiting and headaches as well as dialysis, etc.   PAST MEDICAL HISTORY:  1. End-stage renal disease secondary to diabetes mellitus.  2. Diabetes mellitus, type 2 times 15 years.  3. History of migraines.  4. History of gastroesophageal reflux disease.  5. History of hypertension.  6. History of depression/anxiety.  7. History of anemia secondary to end-stage renal disease.  8. History of secondary hyperparathyroidism.  9. Hyperlipidemia.  10.History of diabetic gastroparesis?   MEDICATIONS:  1. Labetalol 300 mg p.o. b.i.d.  2. Klonopin 0.5 mg p.o. q.h.s.  3. Lantus 5 units q.h.s.  4. PhosLo 667 mg p.o. t.i.d. a.c.  5. Reglan 10 mg a.c. t.i.d.  6. Nephrovite daily.  7. Restoril 30 mg q.h.s.  8. Ultram 50 mg q.6 hours p.r.n.  9. Tarka 4/240 mg q.h.s.   ALLERGIES:  NO KNOWN DRUG ALLERGIES.   SOCIAL  HISTORY:  Unable to obtain secondary to mental status.  Patient  is apparently not a smoker per his fiance at the telephone number [phone].   PHYSICAL EXAMINATION:  VITAL SIGNS:  Pulse 78 to 85.  Afebrile.  Blood  pressure 169 to 199 over 96 to 121.  Respirations 18 to 20.  Sat 99% on  room air.  GENERAL:  No acute distress.  Positive eye contact, but improper  response the verbal stimuli.  HEENT:  Left pupil is greater than the right pupil by about 0.5 to 1 mm,  probably about 1 mm.  Some positive photophobia.  Extraocular motions  are intact however.  External ear, nose, and lips are all normal.  No thyromegaly noted.  Pulmonary is clear to auscultation bilaterally without use of accessory  muscles or distress.  Cardiovascular is regular rate and rhythm with no murmurs, rubs, or  gallops.  Abdomen is soft, nontender with normoactive bowel sounds.  No  hepatosplenomegaly noted.  Extremities showed no edema.  NEURO:  The patient  is uncooperative, but spontaneously moving all  extremities and responding to stimulus.   Point of care cardiac enzymes:  Myoglobin was greater than 500.  MB was  9.4.  Troponin I was less than 0.05.  Sodium 132, potassium 5.2, bicarb  27, chloride 86, BUN 37, creatinine 9.74, glucose 168, calcium 10.2.  White count 8.0, hemoglobin 13.7, platelets 233, absolute neutrophil  count 6.2.  TSH is pending.  Ammonia pending.  Urinalysis pending.  Urine culture pending.  Alcohol level is pending.  Head CT showed no  acute disease, but there were some motion abnormalities.   ASSESSMENT/PLAN:  A 30 year old white male with altered mental status  and multiple medical problems.  1. Altered mental status:  We will check for toxins and TSH and      ammonia and UTI and urinalysis.  Electrolytes at this time are      fairly stable with no evidence of infection on fever or white      count.  Head CT shows no evidence of bleed.  The patient has had a      similar event  that was diagnosed as a migraine in the past by      neurology.  I spoke with Dr. Pearlean Gaines with neurology today on the      phone, and he feels that the isolated pupil defect in the setting      of intact ocular movements and no preceding headache is low risk      for subarachnoid hemorrhage.  His associated elevated blood      pressure could be considered malignant hypertension, although it is      less elevated than might be expected.  We will aggressively try to      lower the blood pressure to goal blood pressure less than 160/100.      See below.  We will admit to step down unit and follow closely.  2. FEN/renal:  Per the renal team.  Patient will likely be dialyzed      tomorrow.  3. Hypertension:  Continue home p.o. meds, and we will start a      nicardipine drip to try to reach goal blood pressure.  4. Diabetes mellitus:  Sliding scale insulin plus Lantus 5 units      q.h.s.  Patient will remain NPO for now until his mental status      clears.  5. Psych/history of depression:  We will hold the psychotropic      medications including the benzodiazepine at this time now, but we      will watch closely for withdrawal.  According to his fiance, he      last had these medications last night, so should not be in the      window for withdrawal at this time.  6. Prophylaxis with SCD/TEDs/PPI.      Angeline Slim, M.D.  Electronically Signed     AL/MEDQ  D:  02/27/2007  T:  02/28/2007  Job:  [id number]

## 2010-08-11 NOTE — H&P (Signed)
[name], [place]                 ACCOUNT NO.:  [id number]   MEDICAL RECORD NO.:  [id number]          PATIENT TYPE:  INP   LOCATION:  3735                         FACILITY:  [facility]   PHYSICIAN:  James L. Deterding, M.D.DATE OF BIRTH:  01-Apr-1980   DATE OF ADMISSION:  11/15/2006  DATE OF DISCHARGE:                              HISTORY & PHYSICAL   [facility].   CHIEF COMPLAINT:  Nausea and vomiting and headache.   HISTORY OF PRESENT ILLNESS:  This is a 30 year old white male with end-  stage renal disease secondary to hypertension and diabetes, recently  initiated on hemodialysis earlier this year who presents with a 1-2 day  history of intractable nausea and vomiting with excruciating headache.  The patient has a history of migraine headaches and this feels typical  for him.  He has not tried any over-the-counter medications.  He reports  good home blood sugars, with recently 109 earlier today around 1:30 p.m.  Per report at [facility], the patient's emesis is weakly guaiac  positive.   PAST MEDICAL HISTORY:  1. End stage renal disease on hemodialysis secondary to diabetes and      hypertension.  2. 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  3. Hyperlipidemia.  4. Anemia secondary to chronic kidney disease.  5. Hypertension.  6. GERD.  7. Secondary hyperparathyroidism.  8. Extreme migraines.   ALLERGIES:  NO KNOWN DRUG ALLERGIES.   HOME MEDICATIONS:  1. Tarka 4/240 mg 1/2 tablet p.o. q.h.s.  2. Vitamin with folate p.o. daily.  3. Clonazepam 2.5 mg p.r.n. leg cramps.  4. Glipizide 10 mg p.o. daily.  5. Latest 13 units subcu q.h.s.  6. Lasix 80 mg 2 tablets p.o. daily.  7. Reglan 10 mg p.o. b.i.d.  8. PhosLo 667 mg p.o. t.i.d. with meals.  9. Sodium bicarbonate 650 mg 2 tablets p.o. t.i.d.  10.Restoril 30 mg p.o. q.h.s. as needed for sleep.  11.Labetalol 100 mg p.o. q.h.s.   SOCIAL HISTORY:  The  patient lives in [place] with his mother.  He has  a history of tobacco use but denies alcohol or drug use.   REVIEW OF SYSTEMS:  The patient denies fever, chills, runny nose.  Does  report headache and photophobia.  Denies chest pain, shortness of  breath, cough or wheezing.  Does report mild ankle swelling and possible  claudication versus leg cramp secondary dialysis.  Denies urinary  frequency or urgency, weakness.  Does report decreased sleep.  Also  reports to leg aches since starting hemodialysis.  Reports nausea and  vomiting for the last 2 days.  No diarrhea. No bloody stools.  No  abdominal pain or bowel habit changes.   PHYSICAL EXAMINATION:  VITAL SIGNS:  Temperature is 98.9, pulse 95,  respiratory rate 20, blood pressure 171/104, oxygen saturation 96% on  room air.  In general, the patient is awake, alert and in no acute  distress.  HEENT: Head is normocephalic and atraumatic.  Pupils are equal, round,  reactive to light.  Extraocular movements are  intact.  Sclerae are  clear.  Nares without discharge.  Moist mucous membranes.  Oropharynx  without erythema or exudate.  CARDIOVASCULAR: Regular rate and rhythm with normal S1-S2 without  murmurs, rubs or gallops.  Dorsalis pedis pulses are 2+ and equal  bilaterally.  LUNGS:  Clear to auscultation without wheezes, rales or rhonchi.  SKIN:  No rash or lesions.  ABDOMEN:  Soft and nontender with normoactive bowel sounds.  No rebound  or guarding.  EXTREMITIES:  No clubbing, cyanosis or edema.  Access:  Left upper  extremity AV fistula with positive thrill.  MUSCULOSKELETAL:  No joint deformity or effusions.  NEUROLOGICAL:  The patient is alert and oriented x3 with cranial nerves  II-XII grossly intact, 5/5 strength bilaterally.  DTRs symmetric and  normal bilaterally.  Downgoing Babinski's bilaterally, normal sensation.   LABORATORY DATA:  White blood cell count 7.6, hemoglobin 11.0, platelets  222, sodium 141, potassium  3.9, bicarb 35, BUN 29, creatinine 7.49,  glucose 70, calcium 9.4.   ASSESSMENT/PLAN:  This is 30 year old white male with end-stage renal  disease on hemodialysis times 2-3 months who presents with intractable  nausea and vomiting likely secondary to migraine.   PROBLEMS:  1. Nausea and vomiting, likely secondary to migraine.  It is already      improving status post antiemetics and ED.  Will continue Phenergan      and Zofran as needed for symptomatic relief.  Will give renal      clears as tolerated and advance to a renal diet as tolerated.  Will      start PPI given heme-positive emesis.  2. Migraines:  This is already improving.  Jolene Provost is good prophylaxis.      We will use Imitrex as needed as a __________ .  3. Diabetes:  Will hold Lantus tonight and check CCG's q.a.c. and      q.h.s., with sensitive sliding scale insulin with meals.  Will hold      glipizide until tolerating p.o. intake.  Will continue Reglan for      gastroparesis.  4. Hypertension:  Will increase Tarka to 4/240 mg one full tablet p.o.      q.h.s. and stop Labetalol.  5. End stage renal disease on hemodialysis:  Will continue      hemodialysis Monday, Wednesday, Friday.  Will dialyze in a.m..  6. Leg cramps.  Will continue Clonazepam 0.5 mg p.o. b.i.d. as needed.  7. Insomnia.  Will continue Restoril as needed.  8. Prophylaxis.  Will give PPI given heme-positive emesis and      encourage the patient to ambulate.      Drue Dun, M.D.  Electronically Signed     ______________________________  Llana Aliment. Deterding, M.D.    EE/MEDQ  D:  11/16/2006  T:  11/16/2006  Job:  [id number]

## 2010-08-11 NOTE — Discharge Summary (Signed)
[name], [name]                 ACCOUNT NO.:  [id number]   MEDICAL RECORD NO.:  [id number]          PATIENT TYPE:  INP   LOCATION:  5508                         FACILITY:  [facility]   PHYSICIAN:  [name], M.D.DATE OF BIRTH:  02-28-1981   DATE OF ADMISSION:  01/10/2007  DATE OF DISCHARGE:  01/12/2007                               DISCHARGE SUMMARY   DISCHARGE DIAGNOSES:  1. Complicated migraine.  2. Encephalopathy with aphasia secondary to #1.  3. Congestive heart failure.  4. Hypertension.  5. Noncompliant with dialysis.  6. End-stage renal disease.  7. Type 1 diabetes mellitus.      a.     Gastroparesis.      b.     Retinopathy.  8. Secondary hyperparathyroidism.  9. Anemia   CONSULTANTS:  Dr. Pearlean Gaines.   PROCEDURES:  1. MRI of the brain without contrast January 10, 2007 showed stable      appearance of the brain, no acute intracranial abnormality.  2. Hemodialysis.   HISTORY OF PRESENT ILLNESS:  [name] is a 30 year old gentleman with  end-stage renal disease secondary to diabetes mellitus with a long  history of hypertension, gastroparesis, anemia, secondary  hyperparathyroidism, and nonadherence to his dialysis having also missed  dialysis the day of presentation because the patient said he was not  feeling well so he did not come.  This has been a problem on and off  with him missing dialysis and signing off early frequently.  Recently he  has not been able to talk as well and has been feeling bad.  He was more  short of breath, and his speech became worse.  He had a complicated  severe headache, nausea, vomiting in the [facility] emergency room.  He  was treated with pain control, attempts at blood pressure control.  Chest x-ray showed congestive heart failure, and CT of the brain was  negative.  He was transferred to [facility] for hemodialysis  and management.   The patient has history of migraines which frequently are associated  with severe  nausea, vomiting well as severe headaches.  He has had  variable neurologic symptoms during this time to which he has been  worked up in the past.  He said these symptoms started the day prior,  and that is when he missed his dialysis treatment.  He said he had a  number of things going on with his diet over the past weekend.  The  patient was admitted by [name].  Please refer to the  History and Physical for further details and physical exam.   HOSPITAL COURSE:  As indicated migraine.  The patient was dialyzed for  his volume overload.  He was quite confused on presentation and turned  over in the process and infiltrated his dialysis access.  He was  therefore transferred to the intensive care unit for close monitoring  due to his encephalopathic state.  MRI was done with results as  described above, and Dr. Pearlean Gaines saw  the patient for evaluation of his  aphasia and migraine symptoms.  MRI  was nonrevealing.  Dr. Pearlean Gaines  concurred with the previous diagnosis of complicated migraine, and  recommended the patient be on verapamil for migraine prophylaxis.  The  patient actually has been on Tarka which contained the amount of  Cardizem that he recommended.  However, it is not known whether the  patient has been taking this consistently.  Within 24 hours the  patient's symptoms resolved.  He was alert, oriented, and conversant.  He had no recollection the previous day, and by October 16 he was deemed  safe for discharge and to continue [name] on a daily basis.  He may use  Ultram for pain control.  1. Encephalopathy with aphasia secondary to #1.  2. Congestive heart failure.  The patient's first dialysis on the 14th      that was cut short by infiltration had a net UF of 1.7 liters.  He      was dialyzed the following day with a net UF of 3.9 liters.  Blood      pressure still was high.  His dry weight was adjusted by Dr.      Kathrene Gaines as listed in the Discharge Summary.  His O2  sats were      good on room air.  3. Hypertension.  This is to be controlled with a combination of Tarka      and volume removal.  If need be this can be further adjusted at his      outpatient dialysis center, but his management is incumbent upon      him attending his full dialysis treatments.  4. Nonadherence to dialysis.  This is an ongoing issue and will be      addressed as needed.  5. End-stage renal disease.  The patient continues on same Monday,      Wednesday, Friday schedule.  His only change in dialysis orders      were his dry weight.  6. Type 1 diabetes mellitus.  The patient continues on Lantus 5 units      at bedtime.  He is also on Reglan for gastroparesis.  7. Secondary hyperparathyroidism.  Calcium on admission was 9.5 with      phosphorous 7.6, albumin 2.8 consistent with not been taking his      binders.  These were rechecked the following day, and phosphorus      was still elevated at 7.  He is discharged on PhosLo as before.  He      is on one with meals.  There is no change in his Zemplar dose, and      this will be monitored per protocol at his dialysis center.  8. Anemia.  Hemoglobin was 11.3 on admission.  The following day it      was 11.2.  It is unclear as to why he had this variance.  The last      hemoglobin at the dialysis center was 12.1.  His Epogen dose is      20,000 units.  This will be monitored per protocol at the dialysis      center.  This may need to be increased.  He is due for monthly labs      soon and is on Venofer 50 mg per week.   CONDITION ON DISCHARGE:  Improved.   DISCHARGE MEDICATIONS:  1. Labetalol 300 mg twice a day.  2. Reglan 10 mg with meals three times a day.  3. Renal vitamin one daily.  4. Tarka 4/240 one  at bedtime.  5. PhosLo 667 mg with meals three times a day.  6. Restoril 30 mg at bedtime.  7. Klonopin 0.5 mg at bedtime.  8. Lantus 5 units at bedtime.  9. Ultram 50 mg every six hours as needed for headache.   10.Anemia.  His dialysis center will check hemoglobin tomorrow and      determine if adjustment in p.o. dose as necessary.   DISCHARGE DIET:  Renal.  Limit fluids to 1200 mL per day.   ACTIVITY:  As tolerated.   FOLLOW-UP APPOINTMENTS:  He should return to dialysis tomorrow.  Discharge orders regarding dialysis were faxed to his center.  His new  dry weight is 80 kg.      Weston Settle, P.A.    ______________________________  Llana Aliment. Gaines, M.D.    MB/MEDQ  D:  01/12/2007  T:  01/13/2007  Job:  [id number]   cc:   Pramod P. Kevin Brownie, MD  [facility]

## 2010-08-11 NOTE — H&P (Signed)
[name], [name] NO.:  [id number]   MEDICAL RECORD NO.:  [id number]          PATIENT TYPE:  INP   LOCATION:  5524                         FACILITY:  [facility]   PHYSICIAN:  Maree Krabbe, M.D.DATE OF BIRTH:  [birth year]   DATE OF ADMISSION:  11/25/2006  DATE OF DISCHARGE:  11/27/2006                              HISTORY & PHYSICAL   Primary kidney center is [facility].   CHIEF COMPLAINT:  Hypoglycemia with altered mental status,  nausea and  vomiting.   HISTORY OF PRESENT ILLNESS:  This is 30 year old white male with end-  stage renal disease secondary to diabetes and hypertension, recently  initiated on hemodialysis earlier this year who was admitted last week  for intractable nausea, vomiting secondary to migraine and who now  presents with altered mental status and hypoglycemia as well as  intractable nausea, vomiting and severe headache.  The patient's fiancee  reports the patient awoke this morning and was stumbling down the  hallway before going into a staring spell and drooling.  EMS was  contacted and the patient's blood sugar was found to be around 70.  Upon  arrival, the patient's mental status rapidly improved after  administration of oral glucose.  The patient has been having more  frequent hypoglycemic episodes over the last few days.  He has had  decreased appetite since initiating hemodialysis.  The patient's  migraines have actually improved since discharge last week with only two  occasions prior to today, both successfully aborted with oxycodone 5 mg  p.o. x1.  The patient has not needed Phenergan suppositories at home.  The patient's nausea and vomiting has remained resolved until today with  onset of headache.   PAST MEDICAL HISTORY:  1. End-stage renal disease on hemodialysis since earlier this year      secondary to diabetes and hypertension.  2. Type 2 diabetes, insulin-dependent with complications of      retinopathy and  gastroparesis.  Last hemoglobin A1c 5.5%.  3. Hyperlipidemia.  4. Hypertension.  5. Anemia secondary to chronic kidney disease.  6. GERD.  7. Secondary hyperparathyroidism.  8. History of migraines.   ALLERGIES:  NO KNOWN DRUG ALLERGIES.   MEDICATIONS:  Home medications:  1. Tarka 4/240 mg p.o. at bedtime.  2. Nephro-Vite p.o. daily.  3. Clonazepam 0.5 mg p.o. at bedtime as needed and prior to      hemodialysis as needed for leg cramps.  4. Glipizide 10 mg p.o. daily.  5. Lantus 13 units subcu at bedtime.  6. Reglan 10 mg p.o. at  q.a.c. and at night.  7. PhosLo 667 mg p.o. t.i.d. with meals.  8. Restroom 30 mg p.o. at bedtime as needed for sleep.  9. Phenergan 25 mg suppositories every 6 hours as needed.  10.Oxycodone 5 mg every 6 hours as needed for migraines.   SOCIAL HISTORY:  The patient lives in [place] as per with his mother.  He is currently engaged.  Has history of tobacco use but does not use  currently.  Denies alcohol or drug use.   FAMILY HISTORY:  Noncontributory.  REVIEW OF SYSTEMS:  The patient denies fever, chills, sweats, nasal  discharge, rash, chest pain, shortness of breath, polyuria, polydipsia,  dysuria, hematuria, weakness, numbness, myalgias, or arthralgias,  diarrhea, bright red blood per rectum, melena, abdominal pain or bowel  habit changes.  The patient does endorse headache and nausea and  vomiting as above.  Otherwise 10-system review of systems negative.   PHYSICAL EXAMINATION:  VITAL SIGNS:  Temperature is 96, pulse 85-88,  respiratory rate 18-24, blood pressure 180/104 which decreased to  170/94.  Oxygen saturation 99-100% on room air.  GENERAL:  The patient is asleep, arousable, alert and oriented x3.  No  acute distress, ill-appearing and grumpy.  HEENT:  Head is normocephalic and atraumatic.  Extraocular movements are  intact.  Sclerae are clear.  Nose is without discharge.  Moist mucous  membranes.  NECK:  Has no JVD.   CARDIOVASCULAR:  Heart is regular rate and rhythm.  Normal S1 and S2.  No murmurs, rubs or gallops.  PULSES:  Dorsalis pedis pulses are 2+ and equal bilaterally.  LUNGS:  Clear to auscultation with reduced rales or rhonchi except for  minimal bibasilar crackles.  SKIN:  Has no rashes.  ABDOMEN:  Soft and nontender with normoactive bowel sounds and no  rebound or guarding.  EXTREMITIES:  No clubbing, cyanosis or edema.  ACCESS:  The patient's left upper extremity AV fistula has palpable  thrill.  MUSCULOSKELETAL:  No joint deformity or effusions.  NEUROLOGIC:  Alert and oriented x3.  Cranial nerves II-XII grossly  intact.  Moving all four extremities well and willing to cooperate with  full examination.   LABORATORY DATA:  CBC reveals white blood cell count of 6.8, hemoglobin  12.2, platelets of 239, absolute neutrophil count of 5.4.  I-STAT  reveals sodium of 128, potassium is 6.8 which decreased to 4.5 on serum  recheck.  Bicarb of 21.9, BUN of 62, creatinine 10.7 and glucose of 171,  up from 174.   ASSESSMENT/PLAN:  This is 30 year old white male with end-stage renal  disease on hemodialysis secondary to diabetes, hypertension, admitted  with  hypoglycemic event, intractable nausea and vomiting and headache.   Problem #1.  Altered mental status secondary to hypoglycemia:  Improved  after glucose gel via EMS.  The patient is no longer symptomatic.  Will  hold glipizide and Lantus for now.  Cover with sliding scale insulin.  Glipizide may not be a good medication for the patient given his  frequent hypoglycemic episodes recently.   Problem #2.  Intractable nausea and vomiting:  Is likely secondary to  migraine given associated headache.  Will treat symptomatically with  Zofran and Phenergan as needed.  Does not seem consistent with  gastroparesis, but we will continue the patient's home dose of Reglan.   Problem #3.  Migraines:  We will continue oxygen at 5 mg as needed as  this  is has been working well for the patient at home and on previous  admission recently.  Will give morphine IV if unable to tolerate p.o.  The patient has an appointment with outpatient neurologist on December 13, 2006.   Problem #4.  End-stage renal disease on hemodialysis:  The patient has  hemodialysis today and stopped early Wednesday.  Will dialyze tonight.  The patient does not appear extremely volume overload.  Will continue  the patient's normal scheduled Monday, Wednesday and Friday after  tonight.   Problem #5.  Diabetes:  Will hold glipizide and Lantus for now  given  hypoglycemia and cover with sliding scale insulin as per #1.   Problem #6.  Hypertension:  We will resume home meds of Tarka.  When  tolerating p.o. we will give IV hydralazine as needed for a pressure  control until tolerating p.o.   Problem #7.  Anemia:  Continue the patient on InFeD.   Problem #8.  Primary type of secondary hyperparathyroidism:  Will  continue Zocor and PhosLo.   Problem #9.  Gastroesophageal reflux disease.  Will continue Protonix.   Problem #10.  Prophylaxis:  Will place SCDs to lower extremities  bilaterally.   Problem #11.  Disposition:  Pending improvement in #1, 2, and 3.      Drue Dun, M.D.  Electronically Signed      Maree Krabbe, M.D.  Electronically Signed    EE/MEDQ  D:  11/26/2006  T:  11/27/2006  Job:  [id number]

## 2010-08-11 NOTE — Procedures (Signed)
EEG NUMBER:  [id number].   Ordered by Dr. Mellody Dance.  Performed on March 01, 2007.   This is a 30 year old man with two days' worth of altered mental status  with vomiting and low blood sugar with diabetes, renal failure and low  back surgery.   MEDICATIONS:  Listed include:  1. Aranesp.  2. NovoLog.  3. Venofer.  4. Protonix.  5. Zemplar.  6. Ultram.  7. Phenergan.  8. Verapamil.  9. Mavik.  10.Tylenol.  11.Klonopin.  12.Reglan.  13.Tarka.   This was a portable 17-channel EEG with 1 channel devoted to EKG  utilizing the [employer] 10/20 lead placement system.  The patient  was described clinically as being awake and drowsy.  Electrographically,  his level of consciousness was difficult to ascertain.  Background  consisted of an admixture of theta alpha and delta activity with no  clear interhemispheric asymmetry and no definite epileptiform  discharges.  There was quite a lot of movement artifact.  Photic  stimulation was performed but did not produce any significant change in  background activity.  Hyperventilation was not performed.  The EKG  monitor reveals relatively regular rhythm with a rate of 84 beats per  minute.   CONCLUSION:  Abnormal EEG, due to mild slowing and a lot of movement  artifact without any definite focality or seizure activity seen,  clinical correlation is recommended.  The slowness may be due to  encephalopathy from variety of sources.      Catherine A. Orlin Hilding, M.D.  Electronically Signed     FAO:ZHYQ  D:  03/01/2007 14:17:51  T:  03/01/2007 17:05:28  Job #:  [id number]

## 2010-08-14 NOTE — H&P (Signed)
[name], COVAULT NO.:  [id number]   MEDICAL RECORD NO.:  [id number]          PATIENT TYPE:  INP   LOCATION:  1825                         FACILITY:  [facility]   PHYSICIAN:  Bevelyn Buckles. Champey, M.D.DATE OF BIRTH:  02-Sep-1980   DATE OF ADMISSION:  09/11/2005  DATE OF DISCHARGE:                                HISTORY & PHYSICAL   REASON FOR ADMISSION:  Code stroke.   HISTORY OF PRESENT ILLNESS:  [name] is a 30 year old Caucasian male with  multiple medical problems including recent admission in March of 2007 for  headache with elevated blood pressure, nausea, and vomiting. The patient  presented this evening with onset around 12:40 p.m. of speech and language  difficulty, severe headaches, nausea, and vomiting. The patient was in  normal state of health when mother noticed that the side of his face was  swollen and he was having difficulty with his language and speech, not  making sense and his speech was gibberish. EMS was called. Blood sugar was  199. His speech gradually improved and he was almost back to normal. Speech  was very slow. Forty five minutes later, the patient had worsening of  headache and more speech difficulty and not making sense. Had dysarthric  speech with perseveration. He was not responding appropriately to questions.  He became very frustrated and laid down to rest. When he woke up, he still  had severe headache and speech difficulty that persisted. He has had similar  events in the past with severe headaches, elevated blood pressure, nausea,  vomiting, and speech difficulty end of February and March of 2007 and  symptoms improved with blood pressure control. Family denies any recent  illnesses, weakness, numbness, vision changes (he is legally blind),  swallowing problems, chewing problems, vertigo, or loss of consciousness.   PAST MEDICAL HISTORY:  Positive for diabetes, hypertension, chronic renal  insufficiency, blindness,  hyperkalemia.   MEDICATIONS:  Include Lantus, sodium bicarbonate, Verapamil, Glipizide,  Reglan, Hectorol, Labetalol.   ALLERGIES:  NO KNOWN DRUG ALLERGIES.   FAMILY HISTORY:  Positive for diabetes, hypertension, heart disease, TIA's,  and kidney problems.   SOCIAL HISTORY:  The patient lives with fiancee. Chews tobacco. Denies any  tobacco and any alcohol or drug use.   REVIEW OF SYSTEMS:  Positive as per HPI and also for depression. Review of  systems negative as per HPI and greater than 8 other organ systems.   PHYSICAL EXAMINATION:  VITAL SIGNS:  Temperature 97.1, blood pressure  191/115, pulse 76, respiratory rate 18. O2 sat is 99% on room air.  HEENT:  Normocephalic. The patient has a bruise under his right eye.  Extraocular muscles intact. Pupils are equal, round, and reactive to light.  NECK:  Supple. No carotid bruits.  HEART:  Regular.  LUNGS:  Clear.  ABDOMEN:  Soft, nontender.  EXTREMITIES:  No edema with good pulses.  NEUROLOGIC:  The patient is awake, confused, perseverating, has receptive  greater than expressive aphasia, dysarthric speech. The patient occasionally  follows commands. Cranial nerves:  Extraocular muscles intact. Pupils are  equal, round,  and reactive to light. Face is symmetric and tongue is  midline. Motor examination, the patient has good strength in all 4  extremities. No drift and a normal tone. Sensory examination is within  normal limits to painful stimuli throughout. Reflexes are 1 to 2+  throughout. Toes are neutral bilaterally. Cerebellar function, the patient  is difficult to assess secondary to patient's poor response and cooperation.  Gait is not assessed secondary to safety.  Stroke scale was 11.   LABORATORY DATA:  Currently pending.   CT of the head showed no acute bleed or infarct.   IMPRESSION/PLAN:  This is a 30 year old Caucasian male with multiple medical  problems who presents with severe headache, aphasia, perseverating,  elevated  blood pressure, nausea, and vomiting. I am concerned with his aphasia and  elevated blood pressure for a left hemispheric stroke. The patient is not a  candidate for IV TPA, as he is greater than 3 hours out from symptoms onset.  Will admit the patient to stroke MD Service. Get a MRI/MRA of the brain,  carotid Doppler's, 2-D echocardiogram. Check labs, toxicity screen, and  alcohol level. I would like to start aspirin on the patient, however, family  states that eye doctor told them to avoid secondary to a vitreous hemorrhage  bilaterally. Will try to contact the patient's ophthalmologist, Dr. Rolland Bimler  in [place] for further discission. There is no response this evening from  his colleague, who is covering. Will also check lipids, homocystine level  and continue his home medications. The patient continues to have elevated  blood pressure in the emergency room, over 200's over 100's. Will start a  Cardene drip on the patient to keep his systolic blood pressure greater than  180. Will keep the patient NPO until speech clears and place him on IV  fluids. Get PT, OT, and speech consultation. Will consult his primary care  physician, Dr. Darrick Penna, for blood pressure help and kidney function. Will  follow the patient while he is in the hospital.      Bevelyn Buckles. Nash Shearer, M.D.     DRC/MEDQ  D:  09/11/2005  T:  09/11/2005  Job:  [id number]   cc:   Salvatore Decent. Cornelius Moras, M.D.  [address]  [place]  [place] [address]

## 2010-08-14 NOTE — Discharge Summary (Signed)
[name], [name]                 ACCOUNT NO.:  [id number]   MEDICAL RECORD NO.:  [id number]          PATIENT TYPE:  INP   LOCATION:  3032                         FACILITY:  [facility]   PHYSICIAN:  Pramod P. Pearlean Brownie, MD    DATE OF BIRTH:  April 11, 1980   DATE OF ADMISSION:  09/11/2005  DATE OF DISCHARGE:  09/14/2005                                 DISCHARGE SUMMARY   DISCHARGE DIAGNOSES:  1.  Transient ischemic attack versus hypertensive crisis.  2.  Dyslipidemia, intolerant to statins in the past.  3.  Hyperhomocysteinemia.  4.  Diabetes.  5.  Hypertension.  6.  Chronic renal insufficiency.  7.  Hypokalemia.  8.  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  9.  History of hypertensive urgency.  10. Anemia.   DISCHARGE MEDICATIONS:  1.  Lantus 15 units at bedtime.  2.  Sodium bicarbonate 650 mg 2 tablets t.i.d.  3.  Glucotrol XL 10 mg q.a.m.  4.  Detrol 0.5 mcg a day.  5.  Foltx 1 a day.  6.  Reglan 10 mg q.i.d.  7.  Aspirin 81 mg a day through September 26, 2005 then DC.  8.  Aggrenox 1 day through September 26, 2005 then increase to b.i.d.  9.  Tylenol 2 tablets 30 minutes before Aggrenox dose x1 week only.  10. Labetalol 200 mg 2 tablets b.i.d.  11. Pravachol 40 mg a day.  12. Lasix 80 mg b.i.d.  13. Tarka 4/240 at bedtime.   STUDIES PERFORMED:  1.  CT of the head on admission showed no acute abnormality.  2.  Chest x-ray showed no acute disease.  3.  MRI of the brain showed no acute abnormality.  4.  MRA of the head was unremarkable for stenosis.  5.  EKG showed normal sinus rhythm with right axis deviation and possible      right ventricular hypertrophy.  No significant change since prior EKG.  6.  Carotid Doppler showed no ICA stenosis.  7.  2D echocardiogram with EF of 60% and no cardioembolic source of embolus.   LABORATORY DATA:  Hemoglobin 12.8, hematocrit 37, white blood cell 6.4, red  blood cell is 4.17, MCV 88.8, MCHC 34.5, RDW 13.1 and platelets 270.  Coagulation studies  on admission were normal.  Chemistry with glucose  ranging from 48-110.  BUN 37, creatinine 3.7, total protein 5.4, albumin  2.9, sodium 136, potassium 4.6, chloride 107, CO2 23 and calcium 8.8.  His  liver function tests were normal.  Hemoglobin A1c was 5.7, homocystine 25.7,  cholesterol 245, triglycerides 91, HDL 32 and LDL 195.  Alcohol screen was  negative.  Urine drug screen was negative.  Urinalysis was 0-2 white blood  cells, 3-6 red blood cells, rare epithelial, rare bacteria.   HISTORY OF PRESENT ILLNESS:  Mr. Cabreja is a 30 year old Caucasian male with  history of multiple medical problems including recent admission in March  2007 for headache with elevated blood pressure, nausea and vomiting.  The  patient presents the evening of admission around 12:40 p.m. with speech and  language difficulties, severe headaches,  nausea and vomiting.  He was in his  normal state of health when his mother noticed that the side of his face was  swollen and he was having difficulty with his language and speech, was not  making sense and his speech was gibberish.  EMS was called.  CBG was 199.  His speech gradually improved and he almost returned to normal; 45 minutes  later, the patient had worsening of headache and more difficulty with speech  and not making sense.  He had dysarthria with perseveration.  He was not  responding appropriately to questions and became very frustrated.  He laid  down to take a nap and when he awoke he still had severe headache and speech  difficulty that persisted.  He has had similar events in the past with  severe headaches, elevated blood pressure, nausea and vomiting and speech  difficulty at the end of February and at the beginning of March 2007 and  symptoms improved with blood pressure control.  Patient denies any other  symptoms.  He was admitted to the hospital for further evaluation.   HOSPITAL COURSE:  MRI did not reveal an acute infract.  Renal was  consulted  and did agree with past history of similar episodes due to hypertensive  urgency.  Patient's blood pressure was elevated in the hospital in the 190s  to 120s and multiple medication adjustments were made.  His symptoms did  totally clear and with blood pressure control patient felt better.  He was  felt to be stable to go without any therapy needed and arrangements were  made for discharge home.  He was placed on Aggrenox in the hospital for  secondary stroke prevention and this will continue.  He also had vascular  risk factors of hyperhomocysteinemia for which Foltx was started and needs  to be continued indefinitely as well as elevated cholesterol for which he  was placed back on the statin even though he has been intolerant to them in  the past.  It was hoped that in the small dose it will be less symptomatic.  Patient has no neurologic follow up needed but will need primary care  followup.   CONDITION ON DISCHARGE:  Patient alert and oriented x3.  Speech is clear.  No dysarthria.  No aphasia.  His visual fields are full and face is  symmetric and he has no other focal deficits.  He moves all extremities x4  and his gait is steady.   DISCHARGE PLAN:  1.  Discharged home with family.  2.  No outpatient therapy followup needed.  3.  Aggrenox for secondary stroke prevention.  4.  Followup with Dr. Darrick Penna for risk factor control as well as medical      management.  5.  No neurologic followup needed.      Annie Main, N.P.    ______________________________  Sunny Schlein. Pearlean Brownie, MD    SB/MEDQ  D:  09/14/2005  T:  09/14/2005  Job:  [id number]   cc:   Salvatore Decent. Cornelius Moras, M.D.  [address]  [place]  [place] [address]   Sanjuana Mae, MD

## 2010-08-14 NOTE — Op Note (Signed)
NAMEROMAINE, [name]                 ACCOUNT NO.:  [id number]   MEDICAL RECORD NO.:  [id number]          PATIENT TYPE:  OIB   LOCATION:  2550                         FACILITY:  [facility]   PHYSICIAN:  Beulah Gandy. Ashley Royalty, M.D. DATE OF BIRTH:  Jun 29, 1980   DATE OF PROCEDURE:  10/27/2004  DATE OF DISCHARGE:                                 OPERATIVE REPORT   ADMISSION DIAGNOSIS:  Proliferative diabetic retinopathy, vitreous  hemorrhage, posterior vitreous membranes, vitreoretinal traction.   PROCEDURES:  Pars plana vitrectomy, retinal photocoagulation, membrane peel,  gas injection, intravitreal Kenalog injection left eye.   SURGEON:  Alan Mulder, M.D.   ASSISTANT:  Rosalie Doctor, MA   ANESTHESIA:  General.   DETAILS:  Usual prep and drape, 25 gauge trocars placed at 10, 2 and 4  o'clock 4mm back from the limbus. The infusion was placed at 4 o'clock.  Provisc placed on the corneal surface and the Biom viewing system moved into  place. The pars plana vitrectomy was begun just behind the crystalline lens.  The lighted pick was used.  Vitreous blood was encountered. There was dense  adherence of sheets of vitreous blood to the retina. These were carefully  teased up with the lighted pick and the vitreous cutter. The vitreous was  circumcised at the vitreous base and removed.  All vitreous blood was  removed. The attention was carried to the macular region where posterior  vitreous membranes were encountered. These were lifted with the silicone tip  suction line and engaged with the vitreous pick. They were stripped from  their attachments to the arcades and removed with the vitreous cutter. Once  all vitreous was removed, the endolaser was placed in the eye, 1453 burns  were placed around the retinal periphery with a power 1000 milliwatts, 1000  microns each and 0.1 seconds each.  A washout procedure was performed.  Intravitreal Kenalog 0.1 cc was injected into the vitreous cavity.  2 cc of  vitreous gas was then injected.  The 25 gauge trocars were removed from the  eye. The wounds were tested and found to be tight. Polymyxin and gentamicin  were irrigated into  Tenon's space. Marcaine was injected around the globe  for postop pain, Decadron 10 milligrams was injected into the lower  subconjunctival space.  Tobradex ophthalmic ointment, a patch and shield  were placed. The closing pressure was 15 with a Barraquer tonometer.   COMPLICATIONS:  None.   DURATION:  1 hour.   The patient was awakened, taken to recovery in satisfactory condition.       JDM/MEDQ  D:  10/27/2004  T:  10/28/2004  Job:  [id number]

## 2010-08-14 NOTE — Op Note (Signed)
[name], [name]                 ACCOUNT NO.:  [id number]   MEDICAL RECORD NO.:  [id number]          PATIENT TYPE:  AMB   LOCATION:  DFTL                         FACILITY:  [facility]   PHYSICIAN:  Janetta Hora. Fields, MD  DATE OF BIRTH:  [birth year]   DATE OF PROCEDURE:  02/14/2006  DATE OF DISCHARGE:  02/14/2006                               OPERATIVE REPORT   PROCEDURE:  Left brachiocephalic arteriovenous fistula.   PREOPERATIVE DIAGNOSIS:  End stage renal disease.   POSTOPERATIVE DIAGNOSIS:  End stage renal disease.   SURGEON:  Janetta Hora. Fields, M.D.   ASSISTANT:  Gershon Crane, PA-C.   ANESTHESIA:  Local with IV sedation.   OPERATIVE FINDINGS:  3 mm left cephalic vein.   OPERATIVE DETAILS:  After obtaining informed consent, the patient was  taken to the operating room.  The patient was placed in the supine  position on the operating table.  After adequate sedation, the patient's  entire left upper extremity was prepped and draped in the usual sterile  fashion.  Local anesthesia was infiltrated in the antecubital crease.  A  transverse incision was made in this location and carried down through  the subcutaneous tissues down to the level of the cephalic vein.  The  cephalic vein was approximately 3 to 3.5 mm in diameter.  It was  dissected free circumferentially.  Small side branches were ligated and  divided between silk ties.  The brachial artery was then dissected free  in the medial portion of the incision.  This was controlled proximally  and distally with vessel loops.  The patient was then given 5000 units  of intravenous heparin.  The distal cephalic vein was ligated with a 3-0  silk suture ligature.  The vein was then transected and swung over to  the level of the brachial artery.  The brachial artery was controlled  proximally and distally after giving 5000 units of intravenous heparin.  A longitudinal arteriotomy was made.  The vein was then sewn end of vein  to side  of artery using a running 7-0 Prolene suture.  Just prior to  completion of the anastomosis, it was forward bled, back bled, and  thoroughly flushed.  The anastomosis was secured, clamps were released,  there was a palpable thrill in the fistula immediately.  Next,  hemostasis was obtained.  The subcutaneous tissues were reapproximated  using running 3-0 Vicryl suture.  The skin was closed with 4-0 Vicryl  subcuticular stitch.  The patient was noted to have a palpable radial  pulse at the end of the case.  There was an easily palpable thrill in  the fistula.  The patient tolerated the procedure well and there were no  complications.  Instrument, sponge and needle counts were correct at the  end of the case.  The patient was taken to the recovery room in stable  condition.     Janetta Hora. Fields, MD  Electronically Signed    CEF/MEDQ  D:  02/22/2006  T:  02/22/2006  Job:  [id number]

## 2010-08-14 NOTE — H&P (Signed)
NAMEHILMAN, KISSLING NO.:  [id number]   MEDICAL RECORD NO.:  [id number]          PATIENT TYPE:  INP   LOCATION:  3108                         FACILITY:  [facility]   PHYSICIAN:  Hillery Aldo, M.D.   DATE OF BIRTH:  [birth year]   DATE OF ADMISSION:  06/21/2005  DATE OF DISCHARGE:                                HISTORY & PHYSICAL   CHIEF COMPLAINT:  The patient was transferred from [facility] where his original complaint was headache accompanied by  intractable nausea and vomiting.  This subsequently resolved.  The patient  has no specific complaints at the present time.   HISTORY OF PRESENT ILLNESS:  The patient is a 30 year old male with past  medical history of hypertension and diabetes diagnosed at age 1, who was  admitted to [facility] on June 16, 2005, with the chief  complaint of headache accompanied by intractable nausea and vomiting.  Upon  original evaluation, he was found to be hyperkalemic and in acute renal  failure.  He also had systolic blood pressure up into the 200s.  Given this,  MRI of the head was obtained and was negative for acute intracranial  process, though it did show some bilateral mastoiditis.  He was, therefore,  admitted to the cardiac care unit and put on a labetalol drip for blood  pressure control.  With control of his blood pressure, the nausea, vomiting,  and headache gradually improved.  He was originally scheduled to transfer  last week, but no ICU beds were available.  At that time there was concern  for meningitis.  This is now not likely given the patient's relative  improvement and lack of leukocytosis.  The family, however, did wish him to  be transferred to a tertiary care center for further management and  treatment.   PAST MEDICAL HISTORY:  1.  Hypertension with recent hypertensive emergency.  2.  Acute on chronic renal failure secondary to diabetic nephropathy.  3.  Diabetes since  age 8.  4.  Blindness secondary to bilateral vitreous hemorrhage with diabetic      retinopathy, originally scheduled for surgical intervention by an      ophthalmologist today, but this was cancelled secondary to his transfer      here.  5.  Bilateral mastoiditis by MRI.  6.  Hyperkalemia.   MEDICATIONS ON TRANSFER:  1.  Labetalol drip.  This was stopped just prior to transfer.  2.  Verapamil 80 mg 3 times a day.  3.  Levaquin 250 mg daily.  4.  Norvasc 5 mg daily.  5.  Labetalol 100 mg p.o. twice daily.  6.  Glipizide XL 10 mg daily.  7.  D5.5 normal saline at 50 mL an hour.  8.  Lantus 15 units subcutaneously daily.  9.  Sliding scale insulin  10. Reglan 10 mg 3 times a day.  11. Protonix 40 mg daily.  12. Catapres transdermal patch 0.1 mg weekly.   ALLERGIES:  No known drug allergies.   SOCIAL HISTORY:  The patient is single.  He lives with  his finance and his  natural child and a step child.  He is a lifelong nonsmoker.  Denies any  alcohol or drug use.  He is self employed as a Administrator.   FAMILY HISTORY:  The patient's mother is alive in her 45s and has diabetes.  The patient's father is also alive in his 42s and is healthy.  He has no  siblings.  His biological child is healthy.   REVIEW OF SYSTEMS:  The patient does endorse intermittent temporal headache.  His last headache was this morning.  He also has intermittent nausea and  vomiting. The last time he vomited was last night which he felt was  secondary to overeating.  Denies any chest pain, shortness of breath, or  cough. No changes in his bowel habits, melena, or hematochezia.  No dysuria.  Denies any focal neurologic deficits including difficulty with swallowing,  paresthesias, weakness on one side versus the other, or speech problems.   PHYSICAL EXAMINATION:  VITAL SIGNS: Temperature 98.6, blood pressure 138.76,  pulse 75, respirations 21, O2 saturation 100% on room air.  GENERAL: Well-developed,  well-nourished male in no acute distress.  HEENT: Normocephalic and atraumatic.  Pupils doe react though fairly  sluggishly.  Oropharynx is clear.  Tongue is midline.  Moist mucous  membranes.  CHEST:  Lungs clear to auscultation bilaterally with good air movement.  HEART: Regular rate and rhythm.  No murmurs, rubs, or gallops.  ABDOMEN:  Soft, nontender, nondistended with normoactive bowel sounds.  EXTREMITIES: No cyanosis, clubbing, or edema.  SKIN:  Warm and dry, no rashes.  NEUROLOGIC:  The patient is alert and oriented x3.  Cranial nerves II-XII  grossly intact except for visual exam.  He does see light and can see some  things up close.   LABORATORY DATA:  From [place], sodium 134, potassium 5.2, chloride 100,  bicarb 26, BUN 29, creatinine 3.7, glucose 172, calcium 8.8, magnesium 2.03,  phosphorus 4.6, total bilirubin 0.5, total protein 5.2, albumin 2.6, AST 11,  ALT 22, alkaline phosphatase 61.  White blood cell count 7.1, hemoglobin  9.6, hematocrit 27.1, platelets 235.  Hemoglobin A1c 7.3.  Cholesterol 222,  triglycerides 217, HDL 25,  LDL 154.   The 2-D echocardiography done at [facility] showed LV EF of 50 to 65% with no  regional wall motion abnormality.  There is mild left ventricular  hypertrophy.   ASSESSMENT AND PLAN:  1.  Nausea and vomiting:  This is likely secondary to his recent      hypertensive emergency plus or minus diabetic gastroparesis.  He was      transferred on a regimen of Reglan.  We will continue this and check      gastric emptying study.  2.  Acute renal failure, chronic renal insufficiency: The patient likely has      significant diabetic nephropathy.  He should be under the care of an      nephrologist given the severity of illness. We will initiate the usual      renal workup including renal ultrasound, intact PTH, iron studies, SPEP,      UPEP.  We will ask nephrology to see the patient as well. 3.  Anemia: This is likely secondary to  underlying renal disease.  We will      check iron studies.  He may be a candidate for iron replacement along      with Aranesp.  4.  Hyperkalemia: Continue the patient on low-potassium diet. This is likely  secondary to renal failure/insufficiency versus hypoaldosteronism and      type 4 renal tubular acidosis.  We will check a renal profile now to      rule this out.  5.  Diabetes: Continue patient on Lantus, sliding scale insulin, and      glipizide.  6.  Hyperlipidemia: Will start the patient on statin therapy.  7.  Prophylaxis:  Will initiate gastrointestinal prophylaxis with Protonix.      The patient is ambulatory at this time and will avoid Lovenox unless he      becomes significantly bed bound.           ______________________________  Hillery Aldo, M.D.     CR/MEDQ  D:  06/21/2005  T:  06/22/2005  Job:  [id number]

## 2010-08-14 NOTE — Discharge Summary (Signed)
[name], [name] NO.:  [id number]   MEDICAL RECORD NO.:  [id number]          PATIENT TYPE:  INP   LOCATION:  3106                         FACILITY:  [facility]   PHYSICIAN:  Hillery Aldo, M.D.   DATE OF BIRTH:  [birth year]   DATE OF ADMISSION:  06/21/2005  DATE OF DISCHARGE:  06/23/2005                                 DISCHARGE SUMMARY   PRIMARY CARE Shakemia [place]:  [facility], [place], in [place], [place].   NEPHROLOGIST:  Dr. Darrick Penna   DISCHARGE DIAGNOSES:  1.  Bilateral mastoiditis.  2.  Hypertensive emergency.  3.  Acute-on-chronic renal failure likely secondary to diabetic nephropathy.  4.  History of poorly controlled diabetes.  5.  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  6.  Hyperkalemia, resolved.  7.  Headache.  8.  Nausea and vomiting with questionable diabetic gastroparesis, gastric-      emptying study pending.  9.  Anemia of chronic disease.  10. Hyperlipidemia.   DISCHARGE MEDICATIONS:  1.  Verapamil 80 mg 3 times daily.  2.  Avelox 400 mg daily x7 days.  3.  Norvasc 5 mg daily.  4.  Glucotrol XL 10 mg daily.  5.  Reglan 10 mg 1/2 hour q.a.c. and nightly.  6.  Lipitor 20 mg daily.  7.  Lantus insulin 15 units nightly.  8.  NovoLog insulin 3 units with meals and insulin-sensitive sliding scale.  9.  Labetalol 200 mg twice daily.  10. Vicodin 5/500 one to two tablets q.8 h. p.r.n. headache (#30 dispensed      with no refills).   CONSULTATIONS:  Dr. Darrick Penna of Nephrology.   BRIEF ADMISSION HISTORY OF PRESENT ILLNESS:  The patient is a 30 year old  male who was transferred from [facility] at the request of  the family.  He was originally admitted there and June 16, 2005 secondary  to persistent nausea and vomiting accompanied by headache.  There was some  concern for meningitis, but this was ruled out.  He was also found to be  hyperkalemic and in acute renal failure.   An MRI scan done at the outside  hospital was negative for acute intracranial process.  His blood pressures  were controlled on a labetalol drip.  He was transferred to [facility] on June 21, 2005 for ongoing evaluation and treatment.   PROCEDURES AND DIAGNOSTIC STUDIES:  1.  Renal ultrasound on June 21, 2005 showed a possible punctate      nonobstructive stone in the right kidney.  There was no hydronephrosis.      Kidneys size was at the upper limits of normal for the patient's size      and the patient's height.  Pre- and post-void bladder volumes were 1319      and 554 mL, respectively.  2.  Gastric-emptying study on June 22, 2005:  Results pending.   DISCHARGE LABORATORY VALUES:  Sodium was 137, potassium 4.1, chloride 104,  bicarb 25, BUN 39, creatinine 3.5, glucose 66.  Ferritin was 432, PTH 8.6,  iron 65.  Hemoglobin A1c was 7.3.  Cholesterol 222, triglycerides 217, HDL  25, LDL 154.  TSH was 1.37.  At the time of this dictation, the patient  still has laboratory values pending including an SPEP, a UPEP and a 24-hour  urine for creatinine and protein.   HOSPITAL COURSE:  PROBLEM #1 - HYPERTENSIVE EMERGENCY:  The patient was  transferred from [facility] and prior to his transfer, his  labetalol drip was discontinued.  We were able to control his blood  pressures with oral medications and titrated these down by the time of  discharge.  He has had good control on the above-noted regimen.   PROBLEM #2 - HEADACHES WITH NAUSEA AND VOMITING:  The patient's headache and  nausea and vomiting were thought to be secondary to his hypertensive  emergency.  Additionally, he could have some component diabetic  gastroparesis, given his other diabetic complications.  A gastric emptying  study was completed and is pending at the time of this dictation.  The  patient is empirically being treated with Reglan and his nausea and vomiting  have been controlled.  He  still had an occasional headache, but nothing like  the headache that brought him into the hospital.  He has known bilateral  mastoiditis which could be contributing to the headaches.  He will continue  on antibiotic therapy and p.r.n. Vicodin for control of his headache.   PROBLEM #3 - ACUTE RENAL FAILURE IN THE SETTING OF CHRONIC RENAL  INSUFFICIENCY:  A nephrology consultation was obtained, given the patient's  significant renal dysfunction.  An extensive workup was undertaken.  At this  point, the most likely explanation of his renal failure is progression of  his diabetic nephropathy.  He may also have a component of acute damage from  his hypertensive emergency with impaired autoregulation.  At this point,  would keep off any ARB or ACE inhibitor.  He has followup scheduled with Dr.  Darrick Penna on July 09, 2005.  He will need to remain under the care of a  nephrologist.   PROBLEM #4 - ANEMIA OF CHRONIC DISEASE:  The patient will likely need  Aranesp and iron therapy.  This can be provided through the nephrologist.   PROBLEM #5 - DIABETES WITH HISTORY OF POOR CONTROL:  The patient's  hemoglobin A1c indicates good control over the past 2-3 months.  The  diabetic treatment team did meet with the patient and evaluate his diabetic  regimen and made the recommendations that he will be discharged on.  Also,  the patient will receive counseling at [facility] for his  diabetes.  He was also provided with an insulin starter kit and will need  close followup to ensure that he has been adherent to his diabetes care.   PROBLEM #6 - HYPERLIPIDEMIA:  The patient was found to have hyperlipidemia  and was started on a statin.  He should have a followup check of his liver  function studies done in approximately 6 weeks' time.   DISPOSITION:  The patient will be discharged home.  He should follow up with Dr. Darrick Penna as outlined above.  He should follow up with his primary care   Roye Gustafson, [facility], in 1-2 weeks.  He will be referred to the [facility].  The patient was instructed to  call his primary care  physician or Jamirra Curnow if his headaches get worse.  He is instructed to  continue with a  diabetic and low-salt diet.  Medications were called into  his pharmacy [facility] in [place].   CONDITION ON DISCHARGE:  Improved.           ______________________________  Hillery Aldo, M.D.     CR/MEDQ  D:  06/23/2005  T:  06/24/2005  Job:  [id number]   cc:   [place], Phelan [place]vir, [place] L. Deterding, M.D.  Fax: [phone]

## 2010-08-14 NOTE — Consult Note (Signed)
[name], [name] NO.:  [id number]   MEDICAL RECORD NO.:  [id number]          PATIENT TYPE:  INP   LOCATION:  6707                         FACILITY:  [facility]   PHYSICIAN:  Antonietta Breach, M.D.  DATE OF BIRTH:  [birth year]   DATE OF CONSULTATION:  DATE OF DISCHARGE:  03/03/2007                                 CONSULTATION   REASON FOR CONSULTATION:  Anxiety and mental status changes.   REQUESTING PHYSICIAN:  Zenaida Deed. Mayford Knife, M.D.   HISTORY OF PRESENT ILLNESS:  [name] and is a 30 year old male  admitted to the [facility] on February 27, 2007, with altered  mental status.   The patient developed confusion starting approximately on the February 26, 2007, he had impaired judgment and was not able to follow commands.   His judgment was impaired.  He was having intermittent agitation and  clouding of consciousness.   The patient also has been having some feeling on edge and muscle  tension.  He was receiving Klonopin for this.   He is not currently on any psychotropic medication.  Currently, Mr.  Gaines's sensorium has cleared.  He now is oriented to all spheres.  He  still has some slight short-term recall memory disability, but it is not  interfering with his ability to make decisions or retain data.   PAST PSYCHIATRIC HISTORY:  Mr. Ingwersen does have a history of taking  Klonopin for muscle tension.  He also has a past history of using  Restoril 30 mg for insomnia.   In October 2008, the patient was diagnosed with atypical migraines also  depression was listed in a recent record; however, the patient does not  have depression at this time.  Also he provides no history of  depression.   FAMILY PSYCHIATRIC HISTORY:  None known.   PAST MEDICAL HISTORY:  1. Insulin-dependent diabetes since age 89.  2. Hypertension.  3. Renal failure.  4. Blindness.  5. Gastroparesis.  6. End-stage renal disease.   ALLERGIES:  No known drug  allergies.   MEDICATIONS:  MAR is reviewed.  The patient is not on psychotropic  medication.   Head CT without contrast was degraded with motion.  An MRI of the brain  showed decreased volume for age and mild periventricular white matter  signal.   LABORATORY DATA:  Urine drug screen was positive for barbiturate, folic  acid normal, B12 normal, and hemoglobin A1c normal.  WBC 7.9, hemoglobin  12.1, platelet count 249, alcohol negative.  Sodium 134, BUN 49,  creatinine 11.69, SGOT 15, SGPT 14, and TSH within normal limits.   The patient's CBGs have been running in 192, 110, and 133.   REVIEW OF SYSTEMS:  CONSTITUTIONAL:  Head, eyes, ears, nose, throat,  mouth, neurologic, psychiatric cardiovascular, respiratory,  gastrointestinal, genitourinary, skin, musculoskeletal, hematologic  lymphatic, endocrine, and metabolic all unremarkable.   PHYSICAL EXAMINATION:  VITAL SIGNS:  Temperature 98.2, pulse 79,  respiratory rate 20, blood pressure 156/113, and O2 saturation on room  air 99%.  The patient's weight is 70.4 kg.  GENERAL APPEARANCE:  [name] is a young male lying in a supine  position in his hospital bed with no abnormal involuntary movements.   MENTAL STATUS EXAM:  [name] is alert.  His attention span is within  normal limits.  He has good eye contact.  His concentration is within  normal limits.  His affect is mildly flat.  Mood is within normal  limits.  Fund of knowledge and intelligence are grossly within normal  limits.  Speech involves normal rate and prosody without dysarthria.  Thought process is logical, coherent, and goal-directed.  No looseness  of associations.  Thought content:  No thoughts of harming himself.  No  thoughts of harming others.  No delusions.  No hallucinations.  Memory,  3/3 immediate but 2/3 at 3 minutes.  Insight is partial.  Judgment is  intact   ASSESSMENT:  AXIS I:  1. 293.00 delirium, not otherwise specified now resolved, except for       slight impairment in short-term recall.  2. 293.84 anxiety disorder, not otherwise specified.  AXIS II:  Deferred.  AXIS III:  See past medical history.  AXIS IV:  General medical.  AXIS V:  Global Assessment of Functioning is 55.   [name] has regained his capacity for informed consent.   The etiology of his delirium appears to have been the metabolic  abnormalities accumulating when he was not receiving his normal  frequency of hemodialysis.  The patient likely has a lower threshold for  delirium given his diabetes, hypertension, and MRI of the brain results.   RECOMMENDATIONS:  No further psychotropic treatment.   The patient agrees to call emergency services as needed for any  psychiatric emergency symptoms.   If the patient does redevelop anxiety, would contact one of the  psychiatric clinics attached to [facility], [place], or  [facility].     Antonietta Breach, M.D.  Electronically Signed    JW/MEDQ  D:  08/09/2007  T:  08/10/2007  Job:  [id number]

## 2010-08-14 NOTE — Consult Note (Signed)
[name], [name]                 ACCOUNT NO.:  [id number]   MEDICAL RECORD NO.:  [id number]          PATIENT TYPE:  INP   LOCATION:  3106                         FACILITY:  [facility]   PHYSICIAN:  James L. Deterding, M.D.DATE OF BIRTH:  15-Feb-1981   DATE OF CONSULTATION:  06/21/2005  DATE OF DISCHARGE:                                   CONSULTATION   CONSULTING PHYSICIAN:  Hillery Aldo, M.D.   REASON FOR CONSULTATION:  Acute and chronic renal disease.   HISTORY OF PRESENT ILLNESS:  This is a 30 year old gentleman with diabetes  onset at age of 81, who has a history of hypertension over a few months, he  says.  Family history of hypertension in a grandmother and uncle.  Family  history of diabetes in mother, grandmother and uncle.  Family history of  renal cell carcinoma in an uncle also.  History of one UTI in the past.  No  history of stones or hematuria.  He has had nausea and vomiting off and on  for three to four weeks in the morning.  It worsened on March 21 and he was  admitted to [facility]  He had severe hypertension at that time.  He was  transferred here on March 26.  We were asked to see regarding creatinine of  3.4, it was as high as 3.7 in [place].  Creatinine in August 2006 was 1.7.  The outpatient medications include glipizide and benazepril.  No skin, rash,  arthralgias, but he has a headache in the form of two to three hours of  headache each day for the last few weeks.  Bifrontal, temporal in nature,  and chronic aching, throbbing.  No history of PND, orthopnea, chest pain,  shortness of breath.  He sleeps on one pillow.  Nocturia x1, no dysuria or  hematuria.  He was found to have mastoiditis on MRI.   REVIEW OF SYSTEMS:  HEENT:  He is legally blind.  He has had vitreous  hemorrhages, is followed down in [place] for his eyes.  CARDIOVASCULAR:  As listed above.  He denies dyspnea on exertion or PND or chest pain.  PULMONARY:  Nonsmoker.  He has a chronic cough  in the recent past, usually  in the mornings.  He coughs up a small amount of clear phlegm.  Never been a  smoker.  No history of asthma or hay fever.  GASTROINTESTINAL:  Indigestion,  heartburn, nausea and vomiting each morning.  No diarrhea or constipation,  bloody or black stools.  No history of hepatitis or yellow jaundice.  SKIN:  Unremarkable.  MUSCULOSKELETAL:  Unremarkable.  He does have occasional back  pain.   FAMILY HISTORY:  As listed above.   PAST MEDICAL HISTORY:  As listed above.   MEDICATIONS:  Glipizide and benazepril.   He has no known allergies.   The only operations were on his eyes.  The only hospitalization was for his  eyes.   SOCIAL HISTORY:  Nonsmoker, nondrinker.   PHYSICAL EXAMINATION:  GENERAL:  He is in no acute distress.  VITAL SIGNS:  Blood pressure  is 120-130 systolic over 70s/80s.  O2 saturation 99% on room  air.  HEENT:  Fundi are not evaluated at this time.  NECK:  No masses or thyromegaly.  LUNGS:  No rales, rhonchi or wheezes, clear to auscultation, normal  percussion, normal expansion, normal breath sounds.  CARDIOVASCULAR:  Regular rhythm.  There is a grade 1-2/6 systolic ejection  murmur best heard at the left upper sternal border.  PMI is 11 cm lateral to  the __________ left ventricular lift.  Pulses are 2+/4+.  No significant  edema.  No bruits noted.  ABDOMEN:  Positive bowel sounds.  Liver is at the costal margin.  No  splenomegaly.  SKIN:  No active rashes.  MUSCULOSKELETAL:  No deformities of the extremities or neck.  LYMPHATIC:  No significant adenopathy in the axillae or supraclavicular.  There is posterior cervical adenopathy.   LABORATORY DATA:  Sodium 136, potassium 4.5, chloride 105, bicarbonate 25,  creatinine 3.4, BUN of 33, glucose 153, albumin 2.5.  Phosphorus is 5.2.  Hemoglobin 12.2, white count is 6.6, platelets 267.   ASSESSMENT:  1.  Acute kidney injury on top of chronic kidney disease with baseline       creatinine, is early class III chronic kidney disease with a glomerular      filtration rate of about 65, now glomerular filtration rate is now only      about 33.  Acute injury is secondary to volume decrease in the setting      of an ACE inhibitor, which blocks his autoregulation.  We cannot rule      out acute hypertensive injury with edema in the microvasculature.  Do      not have all the records for his past creatinines here.  Hopefully, will      recover some function a little bit at a time.  Need to rule out other      causes, i.e., acute interstitial injury, renal artery stenosis,      dysproteinemia, chronic disease, most likely diabetes.  Need to rule out      other causes also, especially dysproteinemia or renal artery disease.      Severe baseline renal disease and as is anemic, and we need to check a      PTH also.  2.  Hypertension, decreased now.  Would get off the clonidine, use Norvasc      and labetalol acutely.  Sometime in the future we will use an ACE      inhibitor.  Will probably need a diuretic.  3.  Diabetes.  Needs tight control.  Question if glipizide is contributing      to his headache as by his history.  Needs workup whether this could be      mastoiditis or other causes for headache as above.  Question      mastoiditis, sinus problems, cluster headaches, drug-induced.  Need to      get neurology evaluation.  4.  Anemia.  Now okay.  5.  Visual difficulties per his ophthalmologist.   PLAN:  1.  SPEP, UPEP.  2.  Ultrasound.  3.  Urinalysis.  4.  Twenty-four urine.  5.  TIBC.  6.  PTH.           ______________________________  Llana Aliment. Deterding, M.D.     JLD/MEDQ  D:  06/21/2005  T:  06/23/2005  Job:  [id number]

## 2010-12-24 LAB — PHOSPHORUS: Phosphorus: 9.1 — ABNORMAL HIGH

## 2010-12-24 LAB — COMPREHENSIVE METABOLIC PANEL
Albumin: 4.1
BUN: 65 — ABNORMAL HIGH
Creatinine, Ser: 10.19 — ABNORMAL HIGH
Glucose, Bld: 123 — ABNORMAL HIGH
Total Protein: 6.4

## 2010-12-24 LAB — RENAL FUNCTION PANEL
BUN: 24 — ABNORMAL HIGH
CO2: 29
CO2: 27
Calcium: 9.1
Chloride: 95 — ABNORMAL LOW
GFR calc Af Amer: 11 — ABNORMAL LOW
GFR calc non Af Amer: 9 — ABNORMAL LOW
Glucose, Bld: 127 — ABNORMAL HIGH
Glucose, Bld: 107 — ABNORMAL HIGH
Phosphorus: 6.7 — ABNORMAL HIGH
Potassium: 4.2
Potassium: 4.3
Sodium: 138
Sodium: 133 — ABNORMAL LOW

## 2010-12-24 LAB — CBC
HCT: 36.5 — ABNORMAL LOW
HCT: 38 — ABNORMAL LOW
HCT: 36 — ABNORMAL LOW
Hemoglobin: 12.2 — ABNORMAL LOW
Hemoglobin: 12.5 — ABNORMAL LOW
MCHC: 33.4
MCHC: 33.4
MCV: 90.8
MCV: 90.4
Platelets: 166
Platelets: 139 — ABNORMAL LOW
RBC: 4.03 — ABNORMAL LOW
RBC: 3.99 — ABNORMAL LOW
RDW: 17.6 — ABNORMAL HIGH
RDW: 17.7 — ABNORMAL HIGH
WBC: 5.5

## 2010-12-24 LAB — CULTURE, BLOOD (ROUTINE X 2)
Culture: NO GROWTH
Culture: NO GROWTH

## 2010-12-24 LAB — LIPASE, BLOOD: Lipase: 18

## 2010-12-24 LAB — POCT I-STAT, CHEM 8
BUN: 64 — ABNORMAL HIGH
Calcium, Ion: 1.03 — ABNORMAL LOW
Creatinine, Ser: 11.6 — ABNORMAL HIGH
Glucose, Bld: 134 — ABNORMAL HIGH
TCO2: 29

## 2010-12-24 LAB — DIFFERENTIAL
Basophils Absolute: 0
Basophils Relative: 1
Eosinophils Absolute: 0.1
Eosinophils Relative: 1
Monocytes Absolute: 0.7

## 2010-12-24 LAB — AMYLASE: Amylase: 33

## 2011-01-04 LAB — COMPREHENSIVE METABOLIC PANEL
ALT: 14
AST: 15
Alkaline Phosphatase: 39
CO2: 27
Chloride: 88 — ABNORMAL LOW
GFR calc Af Amer: 6 — ABNORMAL LOW
GFR calc non Af Amer: 5 — ABNORMAL LOW
Glucose, Bld: 119 — ABNORMAL HIGH
Potassium: 4.6
Sodium: 134 — ABNORMAL LOW
Total Bilirubin: 1.7 — ABNORMAL HIGH

## 2011-01-04 LAB — POCT I-STAT 4, (NA,K, GLUC, HGB,HCT)
Glucose, Bld: 128 — ABNORMAL HIGH
Operator id: 990026625
Potassium: 4.1

## 2011-01-04 LAB — BASIC METABOLIC PANEL
BUN: 37 — ABNORMAL HIGH
CO2: 27
Calcium: 10.1
Chloride: 86 — ABNORMAL LOW
Creatinine, Ser: 11.24 — ABNORMAL HIGH
Creatinine, Ser: 9.74 — ABNORMAL HIGH
Glucose, Bld: 168 — ABNORMAL HIGH
Potassium: 4.6
Potassium: 5.2 — ABNORMAL HIGH
Sodium: 133 — ABNORMAL LOW

## 2011-01-04 LAB — CBC
HCT: 34.7 — ABNORMAL LOW
HCT: 41.2
Hemoglobin: 12.1 — ABNORMAL LOW
MCHC: 33.2
MCHC: 33.4
MCHC: 33.5
MCHC: 34.4
MCV: 87.3
MCV: 87.9
Platelets: 206
Platelets: 233
Platelets: 247
RBC: 4.13 — ABNORMAL LOW
RBC: 4.31
WBC: 5.2
WBC: 7.9
WBC: 8
WBC: 9.4

## 2011-01-04 LAB — TSH: TSH: 2.774

## 2011-01-04 LAB — DIFFERENTIAL
Basophils Relative: 0
Basophils Relative: 1
Eosinophils Absolute: 0.1 — ABNORMAL LOW
Eosinophils Relative: 1
Lymphs Abs: 0.9
Lymphs Abs: 1.1
Monocytes Relative: 7
Neutro Abs: 7.5
Neutrophils Relative %: 79 — ABNORMAL HIGH
Neutrophils Relative %: 80 — ABNORMAL HIGH

## 2011-01-04 LAB — CK TOTAL AND CKMB (NOT AT ARMC)
CK, MB: 13.4 — ABNORMAL HIGH
Relative Index: 9.6 — ABNORMAL HIGH
Total CK: 139

## 2011-01-04 LAB — TROPONIN I: Troponin I: 0.05

## 2011-01-04 LAB — FOLATE: Folate: >20

## 2011-01-04 LAB — RENAL FUNCTION PANEL
Albumin: 2.9 — ABNORMAL LOW
BUN: 36 — ABNORMAL HIGH
CO2: 25
Calcium: 9.6
Chloride: 93 — ABNORMAL LOW
Creatinine, Ser: 7.02 — ABNORMAL HIGH
GFR calc Af Amer: 11 — ABNORMAL LOW
GFR calc non Af Amer: 9 — ABNORMAL LOW

## 2011-01-04 LAB — POCT CARDIAC MARKERS
Myoglobin, poc: >500
Operator id: 234501
Operator id: 270111
Troponin i, poc: <0.05

## 2011-01-04 LAB — DRUG SCREEN PANEL (SERUM)
Amphetamine Scrn: NEGATIVE
Barbiturate Scrn: POSITIVE
Benzodiazepine Scrn: NEGATIVE
Methadone (Dolophine), Serum: NEGATIVE
Methadone (Dolophine), Serum: NEGATIVE
Phencyclidine, Serum: NEGATIVE
Propoxyphene,Serum: NEGATIVE

## 2011-01-04 LAB — VITAMIN B1: Vitamin B1 (Thiamine): 12 nmol/L (ref 9–44)

## 2011-01-04 LAB — HEMOGLOBIN A1C
Hgb A1c MFr Bld: 5.5
Mean Plasma Glucose: 119

## 2011-01-04 LAB — CULTURE, BLOOD (ROUTINE X 2)

## 2011-01-04 LAB — AMMONIA: Ammonia: 17

## 2011-01-04 LAB — PHOSPHORUS: Phosphorus: 8.2 — ABNORMAL HIGH

## 2011-01-06 LAB — URINALYSIS, ROUTINE W REFLEX MICROSCOPIC
Glucose, UA: 250 — AB
Ketones, ur: 15 — AB
Leukocytes, UA: NEGATIVE
Nitrite: NEGATIVE
Protein, ur: >300 — AB
Urobilinogen, UA: 0.2

## 2011-01-06 LAB — RENAL FUNCTION PANEL
Albumin: 2.5 — ABNORMAL LOW
Calcium: 9
GFR calc Af Amer: 7 — ABNORMAL LOW
GFR calc non Af Amer: 6 — ABNORMAL LOW
Phosphorus: 7 — ABNORMAL HIGH
Potassium: 4.3
Sodium: 141

## 2011-01-06 LAB — CBC
HCT: 28.8 — ABNORMAL LOW
Hemoglobin: 9.8 — ABNORMAL LOW
MCHC: 33.8
MCHC: 33.9
Platelets: 185
RDW: 14.7 — ABNORMAL HIGH
RDW: 15.1 — ABNORMAL HIGH

## 2011-01-06 LAB — URINE MICROSCOPIC-ADD ON

## 2011-01-07 LAB — CULTURE, BLOOD (ROUTINE X 2): Culture: NO GROWTH

## 2011-01-07 LAB — DIFFERENTIAL
Lymphs Abs: 0.8
Monocytes Relative: 4
Neutro Abs: 10.8 — ABNORMAL HIGH
Neutrophils Relative %: 89 — ABNORMAL HIGH

## 2011-01-07 LAB — PHOSPHORUS: Phosphorus: 7.6 — ABNORMAL HIGH

## 2011-01-07 LAB — CBC
HCT: 33.7 — ABNORMAL LOW
Hemoglobin: 11.3 — ABNORMAL LOW
MCHC: 33.5
MCV: 88.2
RDW: 15 — ABNORMAL HIGH

## 2011-01-07 LAB — POCT I-STAT 3, ART BLOOD GAS (G3+)
Bicarbonate: 25.4 — ABNORMAL HIGH
Operator id: 270211
Patient temperature: 98
pH, Arterial: 7.442
pO2, Arterial: 51 — ABNORMAL LOW

## 2011-01-07 LAB — COMPREHENSIVE METABOLIC PANEL
BUN: 50 — ABNORMAL HIGH
Calcium: 9.5
Creatinine, Ser: 11.85 — ABNORMAL HIGH
Glucose, Bld: 197 — ABNORMAL HIGH
Total Protein: 5.6 — ABNORMAL LOW

## 2011-01-08 LAB — DIFFERENTIAL
Basophils Relative: 1
Eosinophils Absolute: 0
Eosinophils Absolute: 0
Eosinophils Relative: 1
Eosinophils Relative: 1
Lymphocytes Relative: 22
Lymphs Abs: 0.8
Lymphs Abs: 1.7
Monocytes Absolute: 0.7
Monocytes Relative: 9
Neutrophils Relative %: 80 — ABNORMAL HIGH

## 2011-01-08 LAB — RENAL FUNCTION PANEL
Albumin: 3.3 — ABNORMAL LOW
BUN: 25 — ABNORMAL HIGH
Calcium: 9.9
Creatinine, Ser: 6.58 — ABNORMAL HIGH
GFR calc Af Amer: 12 — ABNORMAL LOW
Phosphorus: 6.3 — ABNORMAL HIGH

## 2011-01-08 LAB — I-STAT 8, (EC8 V) (CONVERTED LAB)
Glucose, Bld: 171 — ABNORMAL HIGH
HCT: 38 — ABNORMAL LOW
Potassium: 6.8
TCO2: 23
pCO2, Ven: 28.3 — ABNORMAL LOW
pH, Ven: 7.497 — ABNORMAL HIGH

## 2011-01-08 LAB — COMPREHENSIVE METABOLIC PANEL
AST: 13
Albumin: 3.2 — ABNORMAL LOW
Alkaline Phosphatase: 41
BUN: 58 — ABNORMAL HIGH
CO2: 21
Chloride: 96
GFR calc Af Amer: 7 — ABNORMAL LOW
GFR calc non Af Amer: 6 — ABNORMAL LOW
Potassium: 4.7
Total Bilirubin: 1.3 — ABNORMAL HIGH

## 2011-01-08 LAB — CBC
HCT: 32 — ABNORMAL LOW
HCT: 35.1 — ABNORMAL LOW
HCT: 36.1 — ABNORMAL LOW
Hemoglobin: 11 — ABNORMAL LOW
MCHC: 34.7
MCV: 89.5
MCV: 90.1
MCV: 90.4
Platelets: 222
Platelets: 239
Platelets: 251
RDW: 16.2 — ABNORMAL HIGH
RDW: 16.3 — ABNORMAL HIGH
WBC: 7.6

## 2011-01-08 LAB — POCT I-STAT CREATININE
Creatinine, Ser: 10.7 — ABNORMAL HIGH
Operator id: 288331

## 2011-01-08 LAB — BASIC METABOLIC PANEL
BUN: 29 — ABNORMAL HIGH
Chloride: 94 — ABNORMAL LOW
GFR calc non Af Amer: 9 — ABNORMAL LOW
Potassium: 3.9
Sodium: 141

## 2011-01-08 LAB — POTASSIUM: Potassium: 4.5

## 2011-10-18 IMAGING — CR DG CHEST 2V
2 series · 2 of 2 positions shown · non-contrast
Comparison: 04/05/2009 study.  02/14/2006 study.

CLINICAL DATA: History of hypertension.  History of preoperative
cardiopulmonary evaluation.

CHEST - 2 VIEW

[view not recorded (1 of 2)]
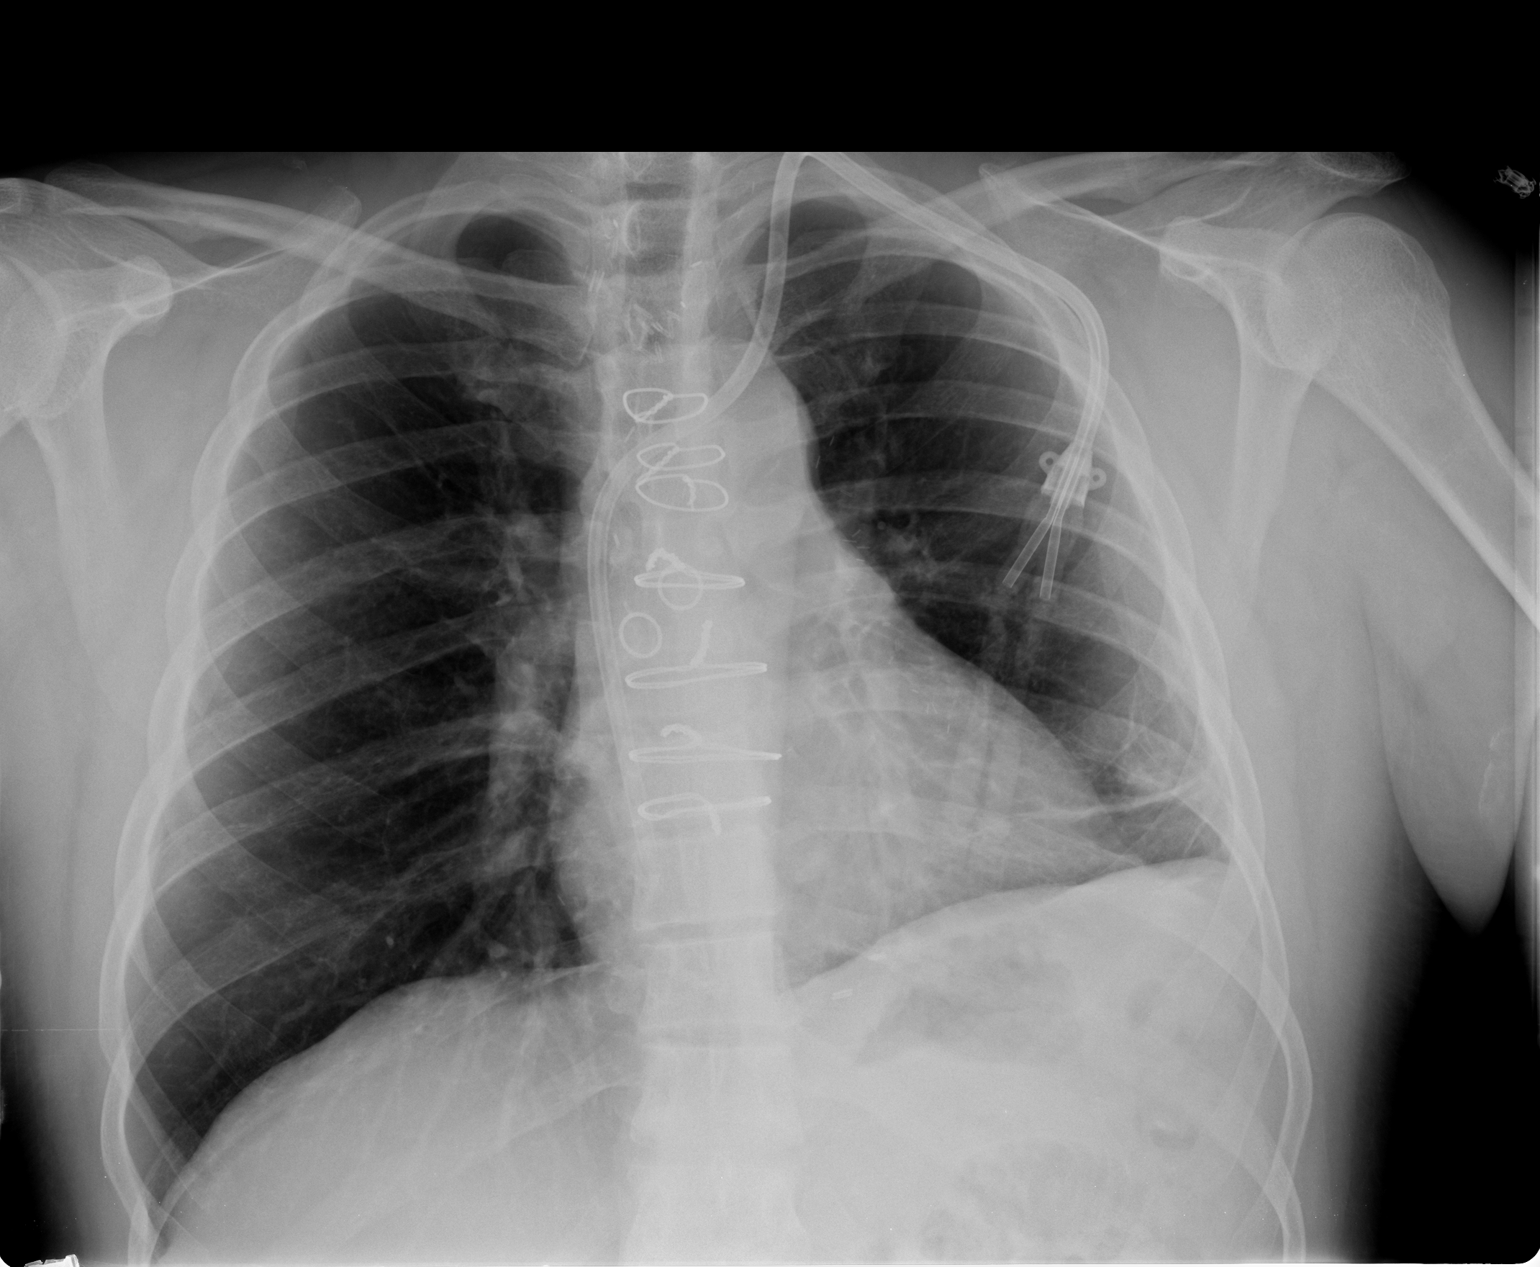

[view not recorded (2 of 2)]
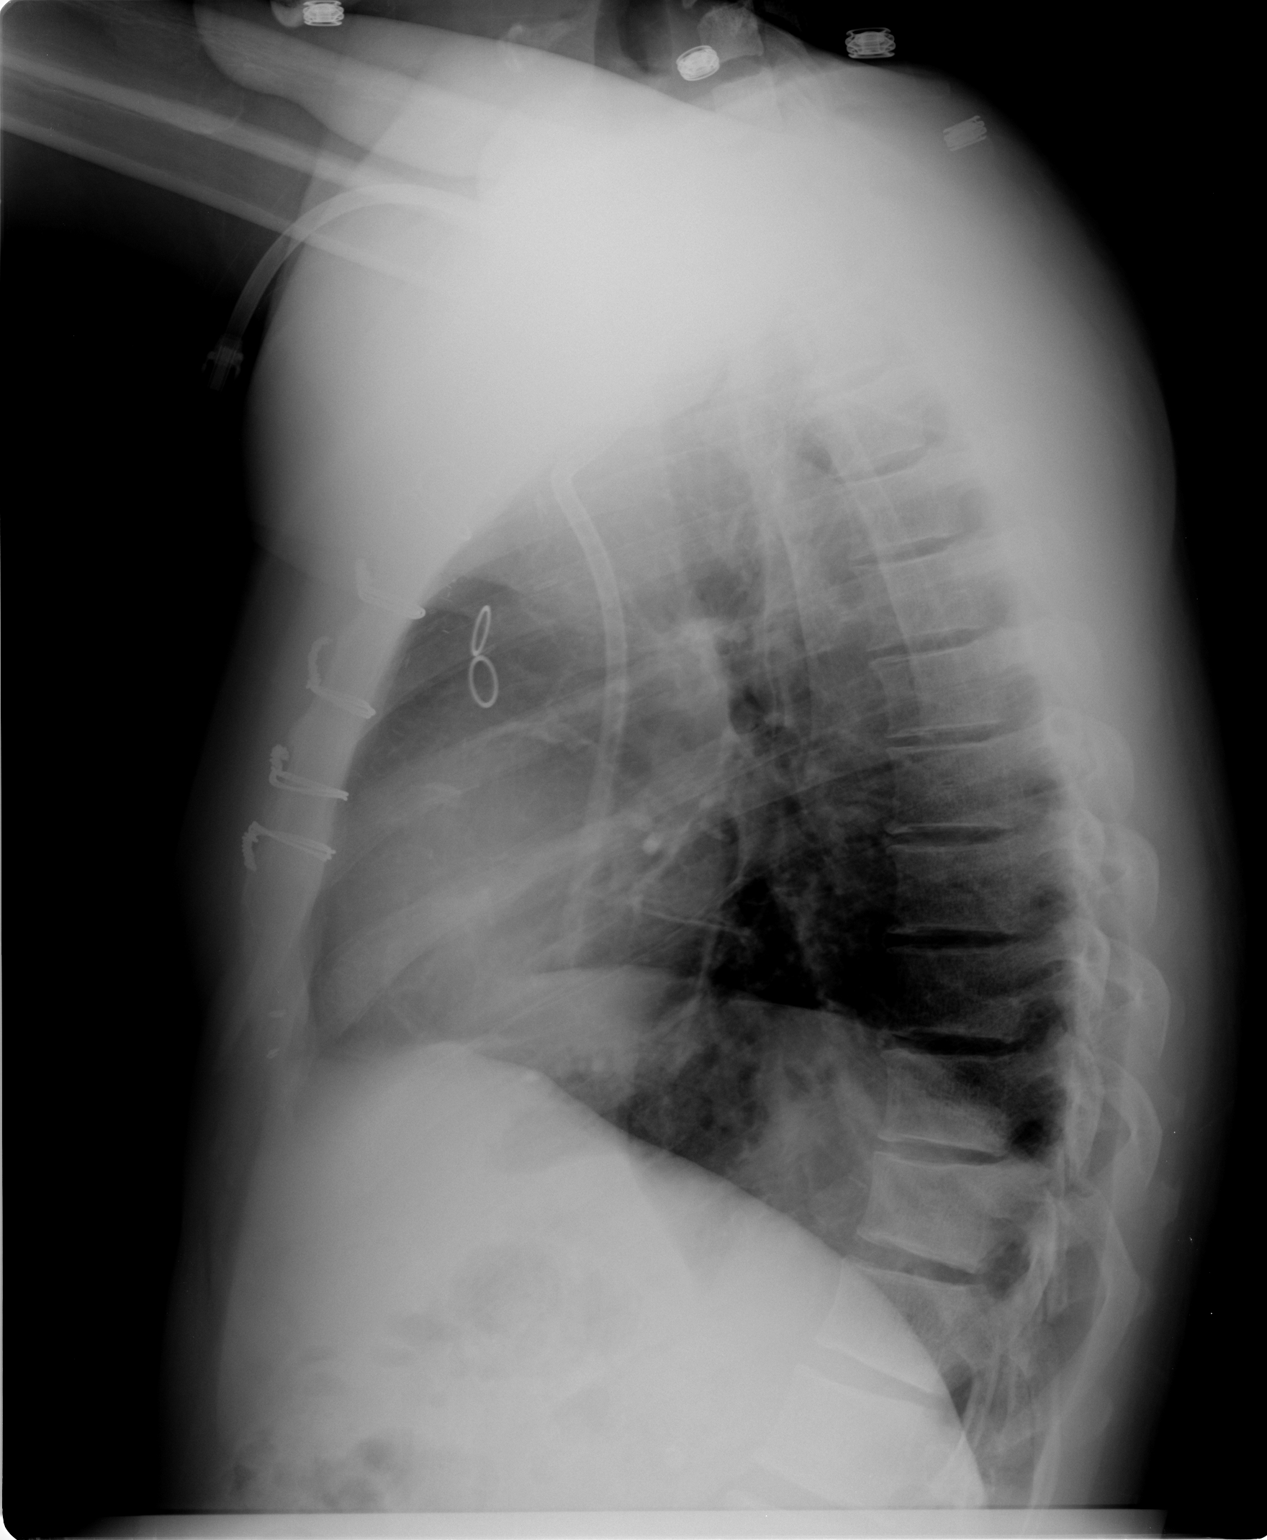

[2 of 2 positions shown; findings below may reference images not displayed]

FINDINGS: The cardiac silhouette is normal size and shape. The
patient has undergone previous median sternotomy and coronary
artery bypass grafting. Left sided dialysis catheter is seen with
tips in right atrial region.  There is slight elevation of left
hemidiaphragm with linear density in the left base consistent with
subsegmental atelectasis or fibrosis unchanged from previous study.
No pulmonary edema, pneumonia, or pleural effusion is seen.
IMPRESSION: Dialysis catheter is in place.  No pneumothorax is seen.  There is
elevation of the left hemidiaphragm with subsegmental atelectasis
or fibrosis in the left base.  No acute superimposed process is
identified.

## 2016-08-03 ENCOUNTER — Other Ambulatory Visit: Payer: Self-pay | Admitting: *Deleted

## 2016-08-03 DIAGNOSIS — S91309A Unspecified open wound, unspecified foot, initial encounter: Secondary | ICD-10-CM

## 2016-08-05 ENCOUNTER — Ambulatory Visit (INDEPENDENT_AMBULATORY_CARE_PROVIDER_SITE_OTHER): Payer: Medicare Other | Admitting: Sports Medicine

## 2016-08-05 ENCOUNTER — Telehealth: Payer: Self-pay | Admitting: *Deleted

## 2016-08-05 ENCOUNTER — Encounter: Payer: Self-pay | Admitting: Sports Medicine

## 2016-08-05 VITALS — BP 145/82 | HR 84 | Temp 97.9°F | Resp 18

## 2016-08-05 DIAGNOSIS — M79671 Pain in right foot: Secondary | ICD-10-CM

## 2016-08-05 DIAGNOSIS — L97409 Non-pressure chronic ulcer of unspecified heel and midfoot with unspecified severity: Secondary | ICD-10-CM | POA: Diagnosis not present

## 2016-08-05 DIAGNOSIS — E11621 Type 2 diabetes mellitus with foot ulcer: Secondary | ICD-10-CM

## 2016-08-05 DIAGNOSIS — I739 Peripheral vascular disease, unspecified: Secondary | ICD-10-CM

## 2016-08-05 MED ORDER — CADEXOMER IODINE 0.9 % EX GEL: 1.0000 "application " | Freq: Every day | CUTANEOUS | 0 refills | Status: AC | PRN

## 2016-08-05 NOTE — Telephone Encounter (Addendum)
Pt states he has questions about iodosorb. I spoke with pt and he said the medication would cost him over $100.00 and he would like an alternative. I told pt to leave today's dressing in place and I would call tomorrow with further instructions. Pt states understanding.08/06/2016-I informed pt that Dr. Marylene LandStover stated he could use OTC Betadine, a pharmacist could direct to the first aid section for pick up. Pt states understanding.

## 2016-08-05 NOTE — Progress Notes (Signed)
Subjective: [name] is a 36 y.o. male patient seen in office for evaluation of ulceration of the right posterior heel. Patient has a history of diabetes and a blood glucose level not recorded. Patient reports that at his dialysis. He noticed the wound and the heel about 2 weeks ago. Since has been using Neosporin with lidocaine, which helps. However, is still painful, especially with direct pressure.. Denies nausea/fever/vomiting/chills/night sweats/shortness of breath/pain. Patient has no other pedal complaints at this time.  There are no active problems to display for this patient.  No current outpatient prescriptions on file prior to visit.   No current facility-administered medications on file prior to visit.    No Known Allergies  No results found for this or any previous visit (from the past 2160 hour(s)).  Objective: There were no vitals filed for this visit.  General: Patient is awake, alert, oriented x 3 and in no acute distress.  Dermatology: Skin is warm and dry bilateral with a Partial thickness ulceration present  Right posterior heel. Ulceration measures 0.5cm x 0.4cm x 0.2cm. There is a  Mildly raised border with a fibrotic base. The ulceration does not  probe to bone. There is no malodor, no active drainage, no erythema, no edema. No acute signs of infection.   Vascular: Dorsalis Pedis pulse = 0/4 Bilateral,  Posterior Tibial pulse = 0/4 Bilateral,  Capillary Fill Time < 5 seconds  Neurologic: Protective sensation, hyperactive bilateral.   Musculosketal:  mild Pain with palpation to ulcerated area. No pain with compression to calves bilateral. No gross bony deformities noted bilateral.   No results for input(s): GRAMSTAIN, LABORGA in the last 8760 hours.  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 -Cleansed ulceration. -AppliedIodosorb  and dry sterile dressing and instructed patient to continue with daily dressings at home consisting of with offloading dressing and offloading heels when it bed or at rest -Patient is scheduled with vein in vascular specialist for circulation test on legs later this month - Advised patient to go to the ER or return to office if the wound worsens or if constitutional symptoms are present. -Patient to return to office in 2-3 weeks for follow up care and evaluation or sooner if problems arise.  [name], DPM

## 2016-08-05 NOTE — Telephone Encounter (Signed)
OTC betadine

## 2016-08-05 NOTE — Progress Notes (Signed)
   Subjective:    Patient ID: [name], male    DOB: 1980-12-03, 36 y.o.   MRN: [id number]  HPI   I do dialysis three times a week and I have a wound on the right heel and has been going on for about two weeks and does not drain and I have been cleaning and using neosporin and does not have a odor and I am a diabetic and my feet do get cold and I have some places on both feet and has been going on for about 2 months    Review of Systems     Objective:   Physical Exam        Assessment & Plan:

## 2016-08-13 ENCOUNTER — Encounter: Payer: Self-pay | Admitting: Vascular Surgery

## 2016-08-20 ENCOUNTER — Encounter: Payer: Medicare Other | Admitting: Vascular Surgery

## 2016-08-20 ENCOUNTER — Ambulatory Visit (HOSPITAL_COMMUNITY): Admission: RE | Admit: 2016-08-20 | Payer: Medicare Other | Source: Ambulatory Visit

## 2016-08-27 ENCOUNTER — Ambulatory Visit: Payer: Medicare Other | Admitting: Sports Medicine

## 2016-08-27 DEATH — deceased

## 2016-09-15 ENCOUNTER — Emergency Department (HOSPITAL_COMMUNITY): Payer: Medicare Other

## 2016-09-15 ENCOUNTER — Inpatient Hospital Stay (HOSPITAL_COMMUNITY): Payer: Medicare Other

## 2016-09-15 ENCOUNTER — Inpatient Hospital Stay (HOSPITAL_COMMUNITY)
Admission: EM | Admit: 2016-09-15 | Discharge: 2016-09-26 | DRG: 602 | Disposition: E | Payer: Medicare Other | Attending: Emergency Medicine | Admitting: Emergency Medicine

## 2016-09-15 DIAGNOSIS — Z940 Kidney transplant status: Secondary | ICD-10-CM | POA: Diagnosis not present

## 2016-09-15 DIAGNOSIS — I872 Venous insufficiency (chronic) (peripheral): Secondary | ICD-10-CM | POA: Diagnosis present

## 2016-09-15 DIAGNOSIS — Z79899 Other long term (current) drug therapy: Secondary | ICD-10-CM

## 2016-09-15 DIAGNOSIS — L03115 Cellulitis of right lower limb: Principal | ICD-10-CM

## 2016-09-15 DIAGNOSIS — I739 Peripheral vascular disease, unspecified: Secondary | ICD-10-CM | POA: Diagnosis not present

## 2016-09-15 DIAGNOSIS — R001 Bradycardia, unspecified: Secondary | ICD-10-CM | POA: Diagnosis not present

## 2016-09-15 DIAGNOSIS — Z951 Presence of aortocoronary bypass graft: Secondary | ICD-10-CM

## 2016-09-15 DIAGNOSIS — I998 Other disorder of circulatory system: Secondary | ICD-10-CM | POA: Diagnosis present

## 2016-09-15 DIAGNOSIS — I959 Hypotension, unspecified: Secondary | ICD-10-CM | POA: Diagnosis not present

## 2016-09-15 DIAGNOSIS — L0390 Cellulitis, unspecified: Secondary | ICD-10-CM | POA: Diagnosis present

## 2016-09-15 DIAGNOSIS — I255 Ischemic cardiomyopathy: Secondary | ICD-10-CM | POA: Diagnosis not present

## 2016-09-15 DIAGNOSIS — I469 Cardiac arrest, cause unspecified: Secondary | ICD-10-CM

## 2016-09-15 DIAGNOSIS — I889 Nonspecific lymphadenitis, unspecified: Secondary | ICD-10-CM | POA: Diagnosis present

## 2016-09-15 DIAGNOSIS — E875 Hyperkalemia: Secondary | ICD-10-CM | POA: Diagnosis not present

## 2016-09-15 DIAGNOSIS — N2581 Secondary hyperparathyroidism of renal origin: Secondary | ICD-10-CM | POA: Diagnosis present

## 2016-09-15 DIAGNOSIS — J9690 Respiratory failure, unspecified, unspecified whether with hypoxia or hypercapnia: Secondary | ICD-10-CM

## 2016-09-15 DIAGNOSIS — R6521 Severe sepsis with septic shock: Secondary | ICD-10-CM | POA: Diagnosis not present

## 2016-09-15 DIAGNOSIS — Z8673 Personal history of transient ischemic attack (TIA), and cerebral infarction without residual deficits: Secondary | ICD-10-CM

## 2016-09-15 DIAGNOSIS — E1152 Type 2 diabetes mellitus with diabetic peripheral angiopathy with gangrene: Secondary | ICD-10-CM

## 2016-09-15 DIAGNOSIS — K219 Gastro-esophageal reflux disease without esophagitis: Secondary | ICD-10-CM | POA: Diagnosis present

## 2016-09-15 DIAGNOSIS — G92 Toxic encephalopathy: Secondary | ICD-10-CM | POA: Diagnosis not present

## 2016-09-15 DIAGNOSIS — E1122 Type 2 diabetes mellitus with diabetic chronic kidney disease: Secondary | ICD-10-CM | POA: Diagnosis present

## 2016-09-15 DIAGNOSIS — Z515 Encounter for palliative care: Secondary | ICD-10-CM | POA: Diagnosis present

## 2016-09-15 DIAGNOSIS — R17 Unspecified jaundice: Secondary | ICD-10-CM | POA: Diagnosis present

## 2016-09-15 DIAGNOSIS — J9600 Acute respiratory failure, unspecified whether with hypoxia or hypercapnia: Secondary | ICD-10-CM | POA: Diagnosis present

## 2016-09-15 DIAGNOSIS — Z794 Long term (current) use of insulin: Secondary | ICD-10-CM | POA: Diagnosis not present

## 2016-09-15 DIAGNOSIS — J9601 Acute respiratory failure with hypoxia: Secondary | ICD-10-CM | POA: Diagnosis not present

## 2016-09-15 DIAGNOSIS — R0602 Shortness of breath: Secondary | ICD-10-CM

## 2016-09-15 DIAGNOSIS — Z452 Encounter for adjustment and management of vascular access device: Secondary | ICD-10-CM

## 2016-09-15 DIAGNOSIS — B999 Unspecified infectious disease: Secondary | ICD-10-CM

## 2016-09-15 DIAGNOSIS — Z4659 Encounter for fitting and adjustment of other gastrointestinal appliance and device: Secondary | ICD-10-CM

## 2016-09-15 DIAGNOSIS — A419 Sepsis, unspecified organism: Secondary | ICD-10-CM | POA: Diagnosis present

## 2016-09-15 DIAGNOSIS — E872 Acidosis: Secondary | ICD-10-CM | POA: Diagnosis present

## 2016-09-15 DIAGNOSIS — I120 Hypertensive chronic kidney disease with stage 5 chronic kidney disease or end stage renal disease: Secondary | ICD-10-CM | POA: Diagnosis present

## 2016-09-15 DIAGNOSIS — I2510 Atherosclerotic heart disease of native coronary artery without angina pectoris: Secondary | ICD-10-CM | POA: Diagnosis present

## 2016-09-15 DIAGNOSIS — Z978 Presence of other specified devices: Secondary | ICD-10-CM

## 2016-09-15 DIAGNOSIS — N186 End stage renal disease: Secondary | ICD-10-CM | POA: Diagnosis present

## 2016-09-15 DIAGNOSIS — Z9114 Patient's other noncompliance with medication regimen: Secondary | ICD-10-CM

## 2016-09-15 DIAGNOSIS — I509 Heart failure, unspecified: Secondary | ICD-10-CM

## 2016-09-15 DIAGNOSIS — Z833 Family history of diabetes mellitus: Secondary | ICD-10-CM

## 2016-09-15 DIAGNOSIS — Z992 Dependence on renal dialysis: Secondary | ICD-10-CM | POA: Diagnosis not present

## 2016-09-15 DIAGNOSIS — G9340 Encephalopathy, unspecified: Secondary | ICD-10-CM | POA: Diagnosis not present

## 2016-09-15 DIAGNOSIS — M79671 Pain in right foot: Secondary | ICD-10-CM | POA: Diagnosis present

## 2016-09-15 DIAGNOSIS — Z8249 Family history of ischemic heart disease and other diseases of the circulatory system: Secondary | ICD-10-CM

## 2016-09-15 DIAGNOSIS — E11649 Type 2 diabetes mellitus with hypoglycemia without coma: Secondary | ICD-10-CM | POA: Diagnosis present

## 2016-09-15 DIAGNOSIS — L97511 Non-pressure chronic ulcer of other part of right foot limited to breakdown of skin: Secondary | ICD-10-CM

## 2016-09-15 DIAGNOSIS — D631 Anemia in chronic kidney disease: Secondary | ICD-10-CM | POA: Diagnosis present

## 2016-09-15 HISTORY — DX: Dependence on renal dialysis: Z99.2

## 2016-09-15 HISTORY — DX: Gastro-esophageal reflux disease without esophagitis: K21.9

## 2016-09-15 HISTORY — DX: Atherosclerotic heart disease of native coronary artery without angina pectoris: I25.10

## 2016-09-15 HISTORY — DX: Presence of aortocoronary bypass graft: Z95.1

## 2016-09-15 HISTORY — DX: Type 2 diabetes mellitus without complications: E11.9

## 2016-09-15 HISTORY — DX: Dependence on renal dialysis: N18.6

## 2016-09-15 HISTORY — DX: Essential (primary) hypertension: I10

## 2016-09-15 LAB — CBC WITH DIFFERENTIAL/PLATELET
BASOS PCT: 0 %
Basophils Absolute: 0 10*3/uL (ref 0.0–0.1)
Eosinophils Absolute: 0 10*3/uL (ref 0.0–0.7)
Eosinophils Relative: 0 %
HEMATOCRIT: 29.1 % — AB (ref 39.0–52.0)
HEMOGLOBIN: 9.8 g/dL — AB (ref 13.0–17.0)
LYMPHS ABS: 1.3 10*3/uL (ref 0.7–4.0)
LYMPHS PCT: 11 %
MCH: 29.3 pg (ref 26.0–34.0)
MCHC: 33.7 g/dL (ref 30.0–36.0)
MCV: 86.9 fL (ref 78.0–100.0)
MONO ABS: 1 10*3/uL (ref 0.1–1.0)
MONOS PCT: 9 %
NEUTROS ABS: 9.4 10*3/uL — AB (ref 1.7–7.7)
NEUTROS PCT: 80 %
Platelets: 199 10*3/uL (ref 150–400)
RBC: 3.35 MIL/uL — ABNORMAL LOW (ref 4.22–5.81)
RDW: 13.1 % (ref 11.5–15.5)
WBC: 11.7 10*3/uL — ABNORMAL HIGH (ref 4.0–10.5)

## 2016-09-15 LAB — COMPREHENSIVE METABOLIC PANEL
ALBUMIN: 2.9 g/dL — AB (ref 3.5–5.0)
ALT: 13 U/L — ABNORMAL LOW (ref 17–63)
ANION GAP: 14 (ref 5–15)
AST: 20 U/L (ref 15–41)
Alkaline Phosphatase: 103 U/L (ref 38–126)
BUN: 18 mg/dL (ref 6–20)
CALCIUM: 9.2 mg/dL (ref 8.9–10.3)
CHLORIDE: 93 mmol/L — AB (ref 101–111)
CO2: 23 mmol/L (ref 22–32)
Creatinine, Ser: 4.63 mg/dL — ABNORMAL HIGH (ref 0.61–1.24)
GFR calc non Af Amer: 15 mL/min — ABNORMAL LOW (ref >60)
GFR, EST AFRICAN AMERICAN: 17 mL/min — AB (ref >60)
GLUCOSE: 220 mg/dL — AB (ref 65–99)
POTASSIUM: 4.1 mmol/L (ref 3.5–5.1)
SODIUM: 130 mmol/L — AB (ref 135–145)
Total Bilirubin: 1 mg/dL (ref 0.3–1.2)
Total Protein: 7 g/dL (ref 6.5–8.1)

## 2016-09-15 LAB — I-STAT CG4 LACTIC ACID, ED: LACTIC ACID, VENOUS: 1.41 mmol/L (ref 0.5–1.9)

## 2016-09-15 MED ORDER — PIPERACILLIN-TAZOBACTAM 3.375 G IVPB
3.3750 g | Freq: Two times a day (BID) | INTRAVENOUS | Status: DC
  Administered 2016-09-15 – 2016-09-16 (×2): 3.375 g via INTRAVENOUS
  Filled 2016-09-15 (×4): qty 50

## 2016-09-15 MED ORDER — FERRIC CITRATE 1 GM 210 MG(FE) PO TABS
210.0000 mg | ORAL_TABLET | Freq: Three times a day (TID) | ORAL | Status: DC
  Administered 2016-09-15: 210 mg via ORAL
  Filled 2016-09-15 (×9): qty 1

## 2016-09-15 MED ORDER — GABAPENTIN 300 MG PO CAPS
300.0000 mg | ORAL_CAPSULE | Freq: Every day | ORAL | Status: DC
Start: 2016-09-15 — End: 2016-09-18
  Administered 2016-09-15: 300 mg via ORAL
  Filled 2016-09-15 (×3): qty 1

## 2016-09-15 MED ORDER — HEPARIN SODIUM (PORCINE) 5000 UNIT/ML IJ SOLN
5000.0000 [IU] | Freq: Three times a day (TID) | INTRAMUSCULAR | Status: DC
  Administered 2016-09-16 – 2016-09-17 (×3): 5000 [IU] via SUBCUTANEOUS
  Filled 2016-09-15 (×6): qty 1

## 2016-09-15 MED ORDER — PANTOPRAZOLE SODIUM 40 MG PO TBEC
40.0000 mg | DELAYED_RELEASE_TABLET | Freq: Every day | ORAL | Status: DC
  Administered 2016-09-16: 40 mg via ORAL
  Filled 2016-09-15: qty 1

## 2016-09-15 MED ORDER — PIPERACILLIN-TAZOBACTAM 3.375 G IVPB 30 MIN: 3.3750 g | Freq: Once | INTRAVENOUS | Status: DC

## 2016-09-15 MED ORDER — CARVEDILOL 12.5 MG PO TABS
12.5000 mg | ORAL_TABLET | Freq: Every day | ORAL | Status: DC
  Administered 2016-09-15: 12.5 mg via ORAL
  Filled 2016-09-15 (×2): qty 1

## 2016-09-15 MED ORDER — VANCOMYCIN HCL IN DEXTROSE 1-5 GM/200ML-% IV SOLN: 1000.0000 mg | Freq: Once | INTRAVENOUS | Status: DC

## 2016-09-15 MED ORDER — HYDROMORPHONE HCL 1 MG/ML IJ SOLN
0.5000 mg | INTRAMUSCULAR | Status: DC | PRN
  Administered 2016-09-15 – 2016-09-16 (×3): 0.5 mg via INTRAVENOUS
  Filled 2016-09-15 (×3): qty 1

## 2016-09-15 NOTE — Consult Note (Signed)
[facility]  Patient name: Kevin BeckJody E Gaines MRN: [id number] DOB: Jan 07, 1981 Sex: male  REASON FOR CONSULT: Cellulitis right foot  HPI: Malva LimesJody E Gaines is a 36 y.o. male, who is extensive past medical history including coronary artery bypass grafting at an [place] age, renal failure. Presents with cellulitis of his right foot. Reports that this is been going on for approximately 2 months with some tissue loss and now is having increased pain and redness. He has been seen by podiatry for a local care and had been referred to our office but presents to the emergency room with increasing pain cellulitis. 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  No past medical history on file.  No family history on file.  SOCIAL HISTORY: Social History   Social History  . Marital status: Single    Spouse name: N/A  . Number of children: N/A  . Years of education: N/A   Occupational History  . Not on file.   Social History Main Topics  . Smoking status: Never Smoker  . Smokeless tobacco: Never Used  . Alcohol use Not on file  . Drug use: Unknown  . Sexual activity: Not on file   Other Topics Concern  . Not on file   Social History Narrative  . No narrative on file    No Known Allergies  Current Facility-Administered Medications  Medication Dose Route Frequency Provider Last Rate Last Dose  . carvedilol (COREG) tablet 12.5 mg  12.5 mg Oral Daily Dolores PattyRiccio, Angela C, DO   12.5 mg at 2017-02-28 2032  . ferric citrate (AURYXIA) tablet 210 mg  210 mg Oral TID WC Riccio, Angela C, DO   210 mg at 2017-02-28 2031  . gabapentin (NEURONTIN) capsule 300 mg  300 mg Oral QHS Riccio, Angela C, DO      . heparin injection 5,000 Units  5,000 Units Subcutaneous Q8H Riccio, Angela C, DO      . HYDROmorphone (DILAUDID) injection 0.5 mg  0.5 mg Intravenous Q2H PRN Dolores PattyRiccio, Angela C, DO   0.5 mg at 2017-02-28 1819  . [START ON 09/16/2016]  pantoprazole (PROTONIX) EC tablet 40 mg  40 mg Oral Daily Riccio, Angela C, DO      . piperacillin-tazobactam (ZOSYN) IVPB 3.375 g  3.375 g Intravenous Q12H Hammons, Kimberly B, RPH 12.5 mL/hr at 2017-02-28 2032 3.375 g at 2017-02-28 2032    REVIEW OF SYSTEMS:  Reviewed in his admission history and physical with nothing to add  PHYSICAL EXAM: Vitals:   2017-02-28 1522 2017-02-28 1545 2017-02-28 1725 2017-02-28 1836  BP: 140/79 (!) 146/86 (!) 161/59 (!) 159/84  Pulse: 89 85 81 82  Resp:   17 18  Temp:    99.5 F (37.5 C)  TempSrc:    Oral  SpO2: 100% 98% 97% 100%  Weight:    164 lb 1.6 oz (74.4 kg)  Height:    5\' 7"  (1.702 m)    GENERAL: The patient is a well-nourished male, in no acute distress. The vital signs are documented above. CARDIOVASCULAR: Right arm with a thrombosed nonfunctional right forearm loop graft and a functioning right upper arm fistula with an excellent thrill He does have 2+ femoral pulses and 2+ popliteal pulses bilaterally. He has a 1+ dorsalis pedis pulse on the left and no pedal pulses on the right. PULMONARY: There is good air exchange  ABDOMEN: Soft and non-tender  MUSCULOSKELETAL: There are no major deformities or  cyanosis. NEUROLOGIC: No focal weakness or paresthesias are detected. SKIN: He has multiple full-thickness eschars which are relatively small over his heel and also over the dorsum of his toes. Does have a cellulitis on his foot extending up to his calf. PSYCHIATRIC: The patient has a normal affect.  DATA:  MRI of his foot is pending  MEDICAL ISSUES: Had long discussion with patient regarding this. He has unfortunately severe tibial disease. Explained that the this may or may not be correctable. Agree with admission for antibiotics. Will follow with you and plan arteriography for further evaluation to determine if treatment options are available. I did explain that he is certainly at risk for below-knee amputation. Should be able to easily heal this if it  comes to it with a palpable popliteal pulse on the right.   Larina Earthly, MD FACS [facility] Specialists of [facility] Tel [phone] Pager [phone]

## 2016-09-15 NOTE — ED Notes (Signed)
Patient transported to X-ray 

## 2016-09-15 NOTE — ED Notes (Signed)
Admitting MD at bedside.

## 2016-09-15 NOTE — ED Notes (Signed)
IV team at bedside 

## 2016-09-15 NOTE — ED Provider Notes (Signed)
[facility]-EMERGENCY DEPT Provider Note   CSN: [id number] Arrival date & time: 09/03/2016  1435     History   Chief Complaint Chief Complaint  Patient presents with  . foot infection    HPI [name] is a 36 y.o. male.  HPI 36 year old male with a history of hypertension, hyperlipidemia, ESRD on dialysis, peripheral vascular disease with chronic wounds of the right lower extremity presents to the ED with worsening right foot pain and infection. He reports that he's been treated for cellulitis with clindamycin. Onset was 2 weeks ago which initially improved however have been worsening over the past several days. Patient was seen at dialysis today and given Vanc/Fortaf.  Pain is exacerbated with palpation of the medial aspect of the right foot, ankle, leg. No alleviating factors. Patient has chronic wounds to toes and heel. No other associated symptoms. No other physical complaints.   No past medical history on file.  There are no active problems to display for this patient.   No past surgical history on file.     Home Medications    Prior to Admission medications   Medication Sig Start Date End Date Taking? Authorizing Provider  AURYXIA 1 GM 210 MG(Fe) tablet Take 210 mg by mouth 3 (three) times daily with meals.  07/15/16  Yes [provider]  cadexomer iodine (IODOSORB) 0.9 % gel Apply 1 application topically daily as needed for wound care. 08/05/16  Yes Stover, Titorya, DPM  carvedilol (COREG) 12.5 MG tablet Take by mouth.   Yes [provider]  gabapentin (NEURONTIN) 300 MG capsule Take 300 mg by mouth at bedtime.  08/30/16  Yes [provider]  insulin lispro (HUMALOG) 100 UNIT/ML injection Inject 2 Units into the skin 3 (three) times daily before meals.   Yes [provider]  lidocaine-prilocaine (EMLA) cream Apply 1 application topically as needed.   Yes [provider]  naproxen sodium (ANAPROX) 220 MG tablet Take 220 mg by mouth  as needed (pain).    Yes [provider]  omeprazole (PRILOSEC) 40 MG capsule Take 40 mg by mouth at bedtime.  07/06/16  Yes [provider]  omeprazole (PRILOSEC) 40 MG capsule Take by mouth.   Yes [provider]    Family History No family history on file.  Social History Social History  Substance Use Topics  . Smoking status: Never Smoker  . Smokeless tobacco: Never Used  . Alcohol use Not on file     Allergies   Patient has no known allergies.   Review of Systems Review of Systems All other systems are reviewed and are negative for acute change except as noted in the HPI   Physical Exam Updated Vital Signs BP (!) 146/86   Pulse 85   Temp 99.6 F (37.6 C) (Oral)   Resp 18   SpO2 98%   Physical Exam  Constitutional: He is oriented to person, place, and time. He appears well-developed and well-nourished. No distress.  HENT:  Head: Normocephalic and atraumatic.  Nose: Nose normal.  Eyes: Conjunctivae and EOM are normal. Pupils are equal, round, and reactive to light. Right eye exhibits no discharge. Left eye exhibits no discharge. No scleral icterus.  Neck: Normal range of motion. Neck supple.  Cardiovascular: Normal rate and regular rhythm.  Exam reveals no gallop and no friction rub.   No murmur heard. Pulses:      Dorsalis pedis pulses are 0 on the right side, and 1+ on the left side.  Posterior tibial pulses are 1+ on the right side, and 1+ on the left side.  Pulmonary/Chest: Effort normal and breath sounds normal. No stridor. No respiratory distress. He has no rales.  Abdominal: Soft. He exhibits no distension. There is no tenderness.  Musculoskeletal: He exhibits no edema.       Right foot: There is tenderness.       Feet:  Neurological: He is alert and oriented to person, place, and time.  Skin: Skin is warm and dry. No rash noted. He is not diaphoretic. There is erythema (to the right foot streaking upthe medial aspect of  the right lower leg).  Psychiatric: He has a normal mood and affect.  Vitals reviewed.    ED Treatments / Results  Labs (all labs ordered are listed, but only abnormal results are displayed) Labs Reviewed  COMPREHENSIVE METABOLIC PANEL - Abnormal; Notable for the following:       Result Value   Sodium 130 (*)    Chloride 93 (*)    Glucose, Bld 220 (*)    Creatinine, Ser 4.63 (*)    Albumin 2.9 (*)    ALT 13 (*)    GFR calc non Af Amer 15 (*)    GFR calc Af Amer 17 (*)    All other components within normal limits  CBC WITH DIFFERENTIAL/PLATELET - Abnormal; Notable for the following:    WBC 11.7 (*)    RBC 3.35 (*)    Hemoglobin 9.8 (*)    HCT 29.1 (*)    Neutro Abs 9.4 (*)    All other components within normal limits  CULTURE, BLOOD (ROUTINE X 2)  CULTURE, BLOOD (ROUTINE X 2)  I-STAT CG4 LACTIC ACID, ED    EKG  EKG Interpretation None       Radiology No results found.  Procedures Procedures (including critical care time)  Medications Ordered in ED Medications - No data to display   Initial Impression / Assessment and Plan / ED Course  I have reviewed the triage vital signs and the nursing notes.  Pertinent labs & imaging results that were available during my care of the patient were reviewed by me and considered in my medical decision making (see chart for details).     Presentation is consistent with failed outpatient management of cellulitis of the right foot with streaking up the right leg, secondary to open wounds on the lower extremity related to peripheral vascular disease. Labs with mild leukocytosis, otherwise reassuring, without evidence of severe sepsis.  Wounds appear to be superficial however will obtain plain films to assess for any possible bony involvement.  He will require admission for failed outpatient management of the cellulitis. Patient already has received initial dose of antibiotics at dialysis.  Final Clinical Impressions(s) / ED  Diagnoses   Final diagnoses:  Cellulitis of right lower extremity  Skin ulcer of right foot, limited to breakdown of skin (HCC)      Cardama, Amadeo GarnetPedro Eduardo, MD 09/07/2016 1615

## 2016-09-15 NOTE — ED Notes (Signed)
IV team RN unable to obtain blood cultures.

## 2016-09-15 NOTE — ED Triage Notes (Addendum)
Pt states he is being seen by the wound clinic for right foot wound. Pt states at dialysis today they took blood cultures and wound cultures. Pt was also given IV antibiotics, Vancomycin at dialysis. Pt has red streaking up the ankle.

## 2016-09-15 NOTE — ED Notes (Signed)
This RN unable to obtain blood cultures.

## 2016-09-15 NOTE — Progress Notes (Signed)
Pharmacy Antibiotic Note  [name] is a 36 y.o. male admitted on Oct 31, 2016 with RLE ceMalva Limesllulitis.  Pharmacy has been consulted for Vancomycin and Zosyn dosing.  Of note, patient was on Clinda outpt x 2 weeks with initial improvement but now worse (last dose ~ 1 week ago).  Rec'd Vancomycin and Fortaz today at outpt HD center (dose unknown).  [facility]  Plan: Random Vanc level with AM labs Dose Vanc PRN.  Goal pre-HD level ~ 25. Zosyn 3.375 gm IV q12h (4 hour infusion).  Height: 5\' 7"  (170.2 cm) Weight: 164 lb 1.6 oz (74.4 kg) IBW/kg (Calculated) : 66.1  Temp (24hrs), Avg:99.6 F (37.6 C), Min:99.5 F (37.5 C), Max:99.6 F (37.6 C)   Recent Labs Lab 29-Jul-2016 1500 29-Jul-2016 1505  WBC 11.7*  --   CREATININE 4.63*  --   LATICACIDVEN  --  1.41    Estimated Creatinine Clearance: 20.2 mL/min (A) (by C-G formula based on SCr of 4.63 mg/dL (H)).    No Known Allergies  Antimicrobials this admission: Vanc 6/20 >> Zosyn 6/20 >>  Dose adjustments this admission:   Microbiology results: 6/20 blood cx (at outpt HD center) >>  Thank you for allowing pharmacy to be a part of this patient's care.  Toys 'R' UsKimberly Keah Lamba, Pharm.D., BCPS Clinical Pharmacist Pager: [phone] Oct 31, 2016 8:11 PM

## 2016-09-15 NOTE — H&P (Signed)
Family Medicine Teaching [facility] Admission History and Physical Service Pager: [phone]  Patient name: [name] Medical record number: [id number] Date of birth: [birth year] Age: 36 y.o. Gender: male  Primary Care Provider: System, Pcp Not In Consultants: none Code Status: full  Chief Complaint: cellulitis   Assessment and Plan: [name] is a 36 y.o. male presenting with cellulitis. PMH is significant for ESRD on dialysis MWF, hypertension, T2DM, GERD, CAD, CABG (2008)   Cellulitis/lymphadenitis of R lower extremity- Worsening, failed 1 week course of outpatient clindamycin. Was followed by wound clinic weekly but only using topical iodine to wounds. Patient's lactic acid 1.41, WBC 11.7. Afebrile with stable vital signs. X-ray without evidence of osteomyelitis. Unable to get blood cultures in ED. Right lower leg cold compared to left but DP pulse found with doppler. Concern for peripheral vascular disease contributing to patient infection and possibly gangrene vs eschar  -Admit to inpatient, attending Dr. Jennette Gaines -vitals per floor -repeat blood cultures -c/s VSS -obtain ABIs -s/p Vanc and Fortaz at HD today -continue antibiotics with vanc and zosyn per pharmacy -consult WOC -MRI right lower extremity - rule out osteomyelitis  -Consulted vascular to evaluate, concern for PVD  ESRD- gets HD MWF -consult nephrology for HD on Friday -continue home phosphate binder  HTN- on coreg 12.5 mg daily -continue home medication -monitor BP  CAD  - Continue coreg   T2DM- taking 2 units novolog with meals at home. Last A1C 5.5 in 2008. -sensitive SSI -CBG AC/HS -repeat A1C  GERD- takes omeprazole daily at home -protonix ordered daily  FEN/GI: renal/carb modified diet, PPI Prophylaxis: heparin   Disposition: pending clinical improvement  History of Present Illness:  [name] is a 36 y.o. male presenting with cellulitis of right foot. He states at the end of April  he noticed a cut on the back of his heel. He put Neosporin on and kept it clean, without significant healing noted. He then noticed cracks on  the bottom of his foot developing  and he decided to go to the foot doctor. The felt the foot doctor was not helpful and did not follow up any further. After another week patient was recommended by the hemodialysis nurse to see another doctor. Patient went to the ED in [place] where he received clindamycin. He took clidamycin for 7 days about  3-4 weeks ago. Patient state that the redness slightly improved after the clindamycin. He also began going to wound care where he was told to place iodine on the wounds. He set up in July to vascular Dr. Darrick Penna in July. Today at HD, there was some concern that patient's wound had worsened significantly They gave him vanc and fortaz. Over the past 4-5 days patient states that there is worsening erythema. Patient noted to have slightly erthyema spreading up his leg. Noted his foot to be cool to the touch, states that he has had decreased blood flow in them for a while, never seen a specialist for this. Positive for chills for past several days.  Review Of Systems: Per HPI with the following additions:   Review of Systems  Constitutional: Positive for chills and fever. Negative for diaphoresis, malaise/fatigue and weight loss.  HENT: Negative for congestion and sore throat.   Eyes: Negative for blurred vision.  Respiratory: Negative for cough and shortness of breath.   Cardiovascular: Negative for chest pain.  Gastrointestinal: Positive for constipation. Negative for abdominal pain, diarrhea, nausea and vomiting.  Genitourinary:  Does not make urine   Musculoskeletal: Negative for falls.  Skin: Positive for rash.  Neurological: Negative for weakness.    Patient Active Problem List   Diagnosis Date Noted  . Cellulitis 09/07/2016    Past Medical History: No past medical history on file.  Past Surgical  History: No past surgical history on file.  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 Smoking status: Never Smoker  . Smokeless tobacco: Never Used  . Alcohol use Not on file   Additional social history: dip up to 1 can a day, usually less than that. No alcohol use, no drug use.  Please also refer to relevant sections of EMR.  Family History: No family history on file. Mom T2DM, CAD, ESRD Father not that I know of  Allergies and Medications: No Known Allergies No current facility-administered medications on file prior to encounter.    Current Outpatient Prescriptions on File Prior to Encounter  Medication Sig Dispense Refill  . AURYXIA 1 GM 210 MG(Fe) tablet Take 210 mg by mouth 3 (three) times daily with meals.     . cadexomer iodine (IODOSORB) 0.9 % gel Apply 1 application topically daily as needed for wound care. 40 g 0  . carvedilol (COREG) 12.5 MG tablet Take by mouth.    . naproxen sodium (ANAPROX) 220 MG tablet Take 220 mg by mouth as needed (pain).     Marland Kitchen. omeprazole (PRILOSEC) 40 MG capsule Take 40 mg by mouth at bedtime.     Marland Kitchen. omeprazole (PRILOSEC) 40 MG capsule Take by mouth.      Objective: BP (!) 159/84 (BP Location: Left Arm)   Pulse 82   Temp 99.5 F (37.5 C) (Oral)   Resp 18   Ht 5\' 7"  (1.702 m)   Wt 164 lb 1.6 oz (74.4 kg)   SpO2 100%   BMI 25.70 kg/m  Exam: General: pleasant man laying in bed in NAD Eyes: PERRL. EOMI ENTM: MMM, no exudate or erythema in posterior oropharynx Neck: supple, non-tender, no LAD Cardiovascular: RRR, no MRG Respiratory: CTAB, no increased WOB Gastrointestinal: soft, NTND, +BS MSK: fistula in right upper extremity. Right lower extremity reddened with redness tracking up mid-shin. Several ulcers over right foot on posterior heel and inbetween toes.  Derm: cellulitis on right lower extremity Neuro: CN2-12 in tact,  strength 5/5 in upper and lower extremities  Psych: appropriate mood and affect  Labs and Imaging: CBC BMET   Recent Labs Lab 11-28-16 1500  WBC 11.7*  HGB 9.8*  HCT 29.1*  PLT 199    Recent Labs Lab 11-28-16 1500  NA 130*  K 4.1  CL 93*  CO2 23  BUN 18  CREATININE 4.63*  GLUCOSE 220*  CALCIUM 9.2     Dg Foot 2 Views Right  Result Date: Jul 07, 2016 CLINICAL DATA:  36 year old male with diabetes, end-stage renal disease on dialysis. Cellulitis treated with clindamycin. Right foot wound and pain. EXAM: RIGHT FOOT - 2 VIEW COMPARISON:  08/20/2016. FINDINGS: Extensive calcified peripheral vascular disease. No subcutaneous gas. Small area of soft tissue swelling along the medial aspect of the right great toe, but the underlying right first ray osseous structures appear intact. No acute fracture or dislocation. No cortical osteolysis identified. IMPRESSION: 1. No radiographic evidence of osteomyelitis or acute osseous abnormality. 2. Advanced calcified peripheral vascular disease. Electronically Signed   By: Althea GrimmerH  Hall M.D.  On: 09/07/2016 16:32     Tillman Sers, DO 09/01/2016, 7:34 PM PGY-1, [facility] FPTS Intern pager: [phone], text pages welcome  UPPER LEVEL ADDENDUM  I have read the above note and made revisions highlighted in blue.  Noralee Chars, MD, PGY-2 [facility]

## 2016-09-16 ENCOUNTER — Inpatient Hospital Stay (HOSPITAL_COMMUNITY): Payer: Medicare Other

## 2016-09-16 ENCOUNTER — Other Ambulatory Visit: Payer: Self-pay

## 2016-09-16 DIAGNOSIS — I509 Heart failure, unspecified: Secondary | ICD-10-CM

## 2016-09-16 DIAGNOSIS — I255 Ischemic cardiomyopathy: Secondary | ICD-10-CM | POA: Diagnosis present

## 2016-09-16 DIAGNOSIS — I998 Other disorder of circulatory system: Secondary | ICD-10-CM | POA: Diagnosis present

## 2016-09-16 DIAGNOSIS — Z794 Long term (current) use of insulin: Secondary | ICD-10-CM

## 2016-09-16 DIAGNOSIS — Z992 Dependence on renal dialysis: Secondary | ICD-10-CM

## 2016-09-16 DIAGNOSIS — E1152 Type 2 diabetes mellitus with diabetic peripheral angiopathy with gangrene: Secondary | ICD-10-CM

## 2016-09-16 DIAGNOSIS — N186 End stage renal disease: Secondary | ICD-10-CM

## 2016-09-16 DIAGNOSIS — I2510 Atherosclerotic heart disease of native coronary artery without angina pectoris: Secondary | ICD-10-CM | POA: Diagnosis present

## 2016-09-16 DIAGNOSIS — I739 Peripheral vascular disease, unspecified: Secondary | ICD-10-CM | POA: Diagnosis present

## 2016-09-16 DIAGNOSIS — G9340 Encephalopathy, unspecified: Secondary | ICD-10-CM

## 2016-09-16 LAB — CBC
HCT: 29.9 % — ABNORMAL LOW (ref 39.0–52.0)
HEMATOCRIT: 29.7 % — AB (ref 39.0–52.0)
Hemoglobin: 9.5 g/dL — ABNORMAL LOW (ref 13.0–17.0)
Hemoglobin: 9.8 g/dL — ABNORMAL LOW (ref 13.0–17.0)
MCH: 28.5 pg (ref 26.0–34.0)
MCH: 29 pg (ref 26.0–34.0)
MCHC: 32 g/dL (ref 30.0–36.0)
MCHC: 32.8 g/dL (ref 30.0–36.0)
MCV: 89.2 fL (ref 78.0–100.0)
MCV: 88.5 fL (ref 78.0–100.0)
PLATELETS: 144 10*3/uL — AB (ref 150–400)
Platelets: 171 10*3/uL (ref 150–400)
RBC: 3.33 MIL/uL — ABNORMAL LOW (ref 4.22–5.81)
RBC: 3.38 MIL/uL — AB (ref 4.22–5.81)
RDW: 13.2 % (ref 11.5–15.5)
RDW: 13.2 % (ref 11.5–15.5)
WBC: 10.8 10*3/uL — AB (ref 4.0–10.5)
WBC: 15.5 10*3/uL — AB (ref 4.0–10.5)

## 2016-09-16 LAB — RENAL FUNCTION PANEL
ALBUMIN: 2.5 g/dL — AB (ref 3.5–5.0)
Anion gap: 16 — ABNORMAL HIGH (ref 5–15)
BUN: 37 mg/dL — AB (ref 6–20)
CALCIUM: 8.7 mg/dL — AB (ref 8.9–10.3)
CO2: 23 mmol/L (ref 22–32)
CREATININE: 7.03 mg/dL — AB (ref 0.61–1.24)
Chloride: 93 mmol/L — ABNORMAL LOW (ref 101–111)
GFR, EST AFRICAN AMERICAN: 10 mL/min — AB (ref >60)
GFR, EST NON AFRICAN AMERICAN: 9 mL/min — AB (ref >60)
Glucose, Bld: 121 mg/dL — ABNORMAL HIGH (ref 65–99)
PHOSPHORUS: 4.7 mg/dL — AB (ref 2.5–4.6)
Potassium: 4.6 mmol/L (ref 3.5–5.1)
SODIUM: 132 mmol/L — AB (ref 135–145)

## 2016-09-16 LAB — POCT I-STAT 3, VENOUS BLOOD GAS (G3P V)
ACID-BASE EXCESS: 2 mmol/L (ref 0.0–2.0)
BICARBONATE: 25.6 mmol/L (ref 20.0–28.0)
O2 Saturation: 59 %
PCO2 VEN: 37.3 mmHg — AB (ref 44.0–60.0)
PH VEN: 7.445 — AB (ref 7.250–7.430)
PO2 VEN: 29 mmHg — AB (ref 32.0–45.0)
Patient temperature: 99
TCO2: 27 mmol/L (ref 0–100)

## 2016-09-16 LAB — BASIC METABOLIC PANEL
ANION GAP: 18 — AB (ref 5–15)
Anion gap: 16 — ABNORMAL HIGH (ref 5–15)
BUN: 35 mg/dL — AB (ref 6–20)
BUN: 39 mg/dL — ABNORMAL HIGH (ref 6–20)
CHLORIDE: 91 mmol/L — AB (ref 101–111)
CHLORIDE: 93 mmol/L — AB (ref 101–111)
CO2: 22 mmol/L (ref 22–32)
CO2: 21 mmol/L — ABNORMAL LOW (ref 22–32)
CREATININE: 6.91 mg/dL — AB (ref 0.61–1.24)
Calcium: 8.8 mg/dL — ABNORMAL LOW (ref 8.9–10.3)
Calcium: 8.9 mg/dL (ref 8.9–10.3)
Creatinine, Ser: 7.54 mg/dL — ABNORMAL HIGH (ref 0.61–1.24)
GFR calc Af Amer: 11 mL/min — ABNORMAL LOW (ref >60)
GFR calc Af Amer: 9 mL/min — ABNORMAL LOW (ref >60)
GFR calc non Af Amer: 9 mL/min — ABNORMAL LOW (ref >60)
GFR, EST NON AFRICAN AMERICAN: 8 mL/min — AB (ref >60)
GLUCOSE: 211 mg/dL — AB (ref 65–99)
Glucose, Bld: 92 mg/dL (ref 65–99)
POTASSIUM: 5.8 mmol/L — AB (ref 3.5–5.1)
POTASSIUM: 6.7 mmol/L — AB (ref 3.5–5.1)
SODIUM: 129 mmol/L — AB (ref 135–145)
SODIUM: 132 mmol/L — AB (ref 135–145)

## 2016-09-16 LAB — GLUCOSE, CAPILLARY
GLUCOSE-CAPILLARY: 47 mg/dL — AB (ref 65–99)
GLUCOSE-CAPILLARY: 82 mg/dL (ref 65–99)
Glucose-Capillary: 181 mg/dL — ABNORMAL HIGH (ref 65–99)
Glucose-Capillary: 122 mg/dL — ABNORMAL HIGH (ref 65–99)
Glucose-Capillary: 111 mg/dL — ABNORMAL HIGH (ref 65–99)
Glucose-Capillary: 153 mg/dL — ABNORMAL HIGH (ref 65–99)
Glucose-Capillary: 118 mg/dL — ABNORMAL HIGH (ref 65–99)

## 2016-09-16 LAB — POCT I-STAT 3, ART BLOOD GAS (G3+)
Acid-base deficit: 7 mmol/L — ABNORMAL HIGH (ref 0.0–2.0)
Bicarbonate: 16 mmol/L — ABNORMAL LOW (ref 20.0–28.0)
O2 Saturation: 98 %
TCO2: 17 mmol/L (ref 0–100)
pCO2 arterial: 24.4 mmHg — ABNORMAL LOW (ref 32.0–48.0)
pH, Arterial: 7.426 (ref 7.350–7.450)
pO2, Arterial: 103 mmHg (ref 83.0–108.0)

## 2016-09-16 LAB — VANCOMYCIN, RANDOM: Vancomycin Rm: 16

## 2016-09-16 LAB — T4, FREE: Free T4: 0.97 ng/dL (ref 0.61–1.12)

## 2016-09-16 LAB — TSH: TSH: 5.745 u[IU]/mL — ABNORMAL HIGH (ref 0.350–4.500)

## 2016-09-16 LAB — TROPONIN I: TROPONIN I: 0.42 ng/mL — AB (ref <0.03)

## 2016-09-16 LAB — LACTIC ACID, PLASMA: LACTIC ACID, VENOUS: 5.3 mmol/L — AB (ref 0.5–1.9)

## 2016-09-16 MED ORDER — VANCOMYCIN HCL IN DEXTROSE 750-5 MG/150ML-% IV SOLN
750.0000 mg | INTRAVENOUS | Status: DC
  Administered 2016-09-17: 750 mg via INTRAVENOUS
  Filled 2016-09-16: qty 150

## 2016-09-16 MED ORDER — SODIUM BICARBONATE 8.4 % IV SOLN
INTRAVENOUS | Status: AC
  Administered 2016-09-16: 50 meq
  Filled 2016-09-16: qty 50

## 2016-09-16 MED ORDER — PRISMASOL BGK 4/2.5 32-4-2.5 MEQ/L IV SOLN
INTRAVENOUS | Status: DC
  Administered 2016-09-16 – 2016-09-17 (×2): via INTRAVENOUS_CENTRAL
  Filled 2016-09-16 (×3): qty 5000

## 2016-09-16 MED ORDER — ALTEPLASE 2 MG IJ SOLR
2.0000 mg | Freq: Once | INTRAMUSCULAR | Status: DC | PRN
  Filled 2016-09-16: qty 2

## 2016-09-16 MED ORDER — SODIUM BICARBONATE 8.4 % IV SOLN
INTRAVENOUS | Status: AC
  Administered 2016-09-16: 100 meq via INTRAVENOUS
  Filled 2016-09-16: qty 50

## 2016-09-16 MED ORDER — NALOXONE HCL 0.4 MG/ML IJ SOLN
0.4000 mg | INTRAMUSCULAR | Status: DC | PRN
  Administered 2016-09-16 (×3): 0.4 mg via INTRAVENOUS
  Filled 2016-09-16 (×2): qty 1

## 2016-09-16 MED ORDER — PRISMASOL BGK 4/2.5 32-4-2.5 MEQ/L IV SOLN
INTRAVENOUS | Status: DC
  Administered 2016-09-16 – 2016-09-17 (×2): via INTRAVENOUS_CENTRAL
  Filled 2016-09-16 (×2): qty 5000

## 2016-09-16 MED ORDER — ONDANSETRON HCL 4 MG PO TABS: 4.0000 mg | ORAL_TABLET | ORAL | Status: DC | PRN

## 2016-09-16 MED ORDER — ORAL CARE MOUTH RINSE
15.0000 mL | Freq: Two times a day (BID) | OROMUCOSAL | Status: DC
  Administered 2016-09-17 (×2): 15 mL via OROMUCOSAL

## 2016-09-16 MED ORDER — DEXTROSE 50 % IV SOLN
INTRAVENOUS | Status: AC
  Administered 2016-09-16: 50 mL
  Filled 2016-09-16: qty 50

## 2016-09-16 MED ORDER — LIDOCAINE-PRILOCAINE 2.5-2.5 % EX CREA: 1.0000 "application " | TOPICAL_CREAM | CUTANEOUS | Status: DC | PRN

## 2016-09-16 MED ORDER — LIDOCAINE HCL (PF) 1 % IJ SOLN: 5.0000 mL | INTRAMUSCULAR | Status: DC | PRN

## 2016-09-16 MED ORDER — INSULIN ASPART 100 UNIT/ML ~~LOC~~ SOLN
0.0000 [IU] | Freq: Three times a day (TID) | SUBCUTANEOUS | Status: DC
  Administered 2016-09-16: 1 [IU] via SUBCUTANEOUS
  Administered 2016-09-16: 2 [IU] via SUBCUTANEOUS

## 2016-09-16 MED ORDER — NALOXONE HCL 0.4 MG/ML IJ SOLN
INTRAMUSCULAR | Status: AC
  Administered 2016-09-16: 0.4 mg via INTRAVENOUS
  Filled 2016-09-16: qty 1

## 2016-09-16 MED ORDER — SODIUM BICARBONATE 8.4 % IV SOLN
50.0000 meq | Freq: Once | INTRAVENOUS | Status: AC
  Administered 2016-09-16: 50 meq via INTRAVENOUS
  Filled 2016-09-16: qty 50

## 2016-09-16 MED ORDER — VANCOMYCIN HCL 500 MG IV SOLR
500.0000 mg | Freq: Once | INTRAVENOUS | Status: AC
  Administered 2016-09-16: 500 mg via INTRAVENOUS
  Filled 2016-09-16 (×2): qty 500

## 2016-09-16 MED ORDER — INSULIN ASPART 100 UNIT/ML IV SOLN
10.0000 [IU] | Freq: Once | INTRAVENOUS | Status: AC
  Administered 2016-09-16: 10 [IU] via INTRAVENOUS

## 2016-09-16 MED ORDER — ATROPINE SULFATE 1 MG/10ML IJ SOSY
1.0000 mg | PREFILLED_SYRINGE | Freq: Once | INTRAMUSCULAR | Status: AC
  Administered 2016-09-16: 1 mg via INTRAVENOUS

## 2016-09-16 MED ORDER — ONDANSETRON HCL 4 MG/2ML IJ SOLN
4.0000 mg | Freq: Four times a day (QID) | INTRAMUSCULAR | Status: DC | PRN
  Administered 2016-09-16: 4 mg via INTRAVENOUS

## 2016-09-16 MED ORDER — DEXTROSE 5 % IV SOLN
0.0000 ug/min | INTRAVENOUS | Status: DC
  Administered 2016-09-16: 10 ug/min via INTRAVENOUS
  Administered 2016-09-17: 65 ug/min via INTRAVENOUS
  Filled 2016-09-16 (×3): qty 16

## 2016-09-16 MED ORDER — ATROPINE SULFATE 1 MG/10ML IJ SOSY
PREFILLED_SYRINGE | INTRAMUSCULAR | Status: AC
  Filled 2016-09-16: qty 10

## 2016-09-16 MED ORDER — PENTAFLUOROPROP-TETRAFLUOROETH EX AERO: 1.0000 "application " | INHALATION_SPRAY | CUTANEOUS | Status: DC | PRN

## 2016-09-16 MED ORDER — INSULIN ASPART 100 UNIT/ML ~~LOC~~ SOLN: 2.0000 [IU] | SUBCUTANEOUS | Status: DC

## 2016-09-16 MED ORDER — PRISMASOL BGK 4/2.5 32-4-2.5 MEQ/L IV SOLN
INTRAVENOUS | Status: DC
  Administered 2016-09-16 – 2016-09-17 (×9): via INTRAVENOUS_CENTRAL
  Filled 2016-09-16 (×19): qty 5000

## 2016-09-16 MED ORDER — NALOXONE HCL 2 MG/2ML IJ SOSY
0.5000 mg/h | PREFILLED_SYRINGE | INTRAVENOUS | Status: DC
  Administered 2016-09-16: 0.5 mg/h via INTRAVENOUS
  Filled 2016-09-16: qty 4

## 2016-09-16 MED ORDER — DEXTROSE 50 % IV SOLN
INTRAVENOUS | Status: AC
  Filled 2016-09-16: qty 50

## 2016-09-16 MED ORDER — HEPARIN SODIUM (PORCINE) 1000 UNIT/ML DIALYSIS
1000.0000 [IU] | INTRAMUSCULAR | Status: DC | PRN
  Filled 2016-09-16: qty 6

## 2016-09-16 MED ORDER — DEXTROSE 50 % IV SOLN
1.0000 | Freq: Once | INTRAVENOUS | Status: AC
  Administered 2016-09-16: 50 mL via INTRAVENOUS
  Filled 2016-09-16: qty 50

## 2016-09-16 MED ORDER — PIPERACILLIN-TAZOBACTAM 3.375 G IVPB 30 MIN
3.3750 g | Freq: Four times a day (QID) | INTRAVENOUS | Status: DC
  Administered 2016-09-16 – 2016-09-17 (×4): 3.375 g via INTRAVENOUS
  Filled 2016-09-16 (×7): qty 50

## 2016-09-16 MED ORDER — SODIUM CHLORIDE 0.9 % IV SOLN: 100.0000 mL | INTRAVENOUS | Status: DC | PRN

## 2016-09-16 MED ORDER — DEXTROSE 5 % IV SOLN
INTRAVENOUS | Status: DC
  Administered 2016-09-16 – 2016-09-17 (×2): via INTRAVENOUS

## 2016-09-16 MED ORDER — DEXTROSE 5 % IV SOLN
0.0000 ug/min | INTRAVENOUS | Status: DC
  Filled 2016-09-16: qty 4

## 2016-09-16 MED ORDER — SODIUM BICARBONATE 8.4 % IV SOLN
100.0000 meq | Freq: Once | INTRAVENOUS | Status: AC
  Administered 2016-09-16: 100 meq via INTRAVENOUS
  Filled 2016-09-16: qty 100

## 2016-09-16 MED ORDER — CHLORHEXIDINE GLUCONATE 0.12 % MT SOLN
15.0000 mL | Freq: Two times a day (BID) | OROMUCOSAL | Status: DC
  Administered 2016-09-17 (×2): 15 mL via OROMUCOSAL

## 2016-09-16 MED ORDER — HEPARIN SODIUM (PORCINE) 1000 UNIT/ML DIALYSIS: 1000.0000 [IU] | INTRAMUSCULAR | Status: DC | PRN

## 2016-09-16 MED ORDER — SODIUM CHLORIDE 0.9 % FOR CRRT
INTRAVENOUS_CENTRAL | Status: DC | PRN
  Filled 2016-09-16: qty 1000

## 2016-09-16 MED ORDER — DEXTROSE 50 % IV SOLN
1.0000 | Freq: Once | INTRAVENOUS | Status: AC
  Administered 2016-09-16: 50 mL via INTRAVENOUS

## 2016-09-16 MED ORDER — ONDANSETRON HCL 4 MG/2ML IJ SOLN: 4.0000 mg | Freq: Four times a day (QID) | INTRAMUSCULAR | Status: DC

## 2016-09-16 MED ORDER — SODIUM CHLORIDE 0.9 % IV SOLN
1.0000 g | Freq: Once | INTRAVENOUS | Status: AC
  Administered 2016-09-16: 1 g via INTRAVENOUS
  Filled 2016-09-16: qty 10

## 2016-09-16 NOTE — Consult Note (Signed)
PULMONARY / CRITICAL CARE MEDICINE   Name: [name] MRN: [id number] DOB: 1980/09/15    ADMISSION DATE:  09/05/2016 CONSULTATION DATE:  6/21  REFERRING MD:  FPTS   CHIEF COMPLAINT:  AMS  HISTORY OF PRESENT ILLNESS:   36yo male with extensive PMH including HTN, CAD s/p CABG, DM, ESRD who presented 6/20 with worsening cellulitis of an ongoing R foot wound.  Lactate was reassuring and xray without signs of osteo.  He was admitted by FPTS for IV abx, wound care and surgical/vascular input.  On 6/21 am he received dilaudid for R foot pain and subsequently had worsening mental status, bradycardia and hypotension.  Had some improvement with narcan but only temporarily.  PCCM consulted for ICU tx.   PAST MEDICAL HISTORY :  CAD s/p CABG  ESRD  DM  HTN   PAST SURGICAL HISTORY: He  has no past surgical history on file.  No Known Allergies  No current facility-administered medications on file prior to encounter.    Current Outpatient Prescriptions on File Prior to Encounter  Medication Sig  . AURYXIA 1 GM 210 MG(Fe) tablet Take 210 mg by mouth 3 (three) times daily with meals.   . cadexomer iodine (IODOSORB) 0.9 % gel Apply 1 application topically daily as needed for wound care.  . carvedilol (COREG) 12.5 MG tablet Take by mouth.  . naproxen sodium (ANAPROX) 220 MG tablet Take 220 mg by mouth as needed (pain).   Marland Kitchen omeprazole (PRILOSEC) 40 MG capsule Take 40 mg by mouth at bedtime.   Marland Kitchen omeprazole (PRILOSEC) 40 MG capsule Take by mouth.    FAMILY HISTORY:  His has no family status information on file.    SOCIAL HISTORY: He  reports that he has never smoked. He has never used smokeless tobacco.  REVIEW OF SYSTEMS:   Difficult to obtain, very drowsy.  C/o R foot pain, otherwise no c/o.     SUBJECTIVE:    VITAL SIGNS: BP 118/75 (BP Location: Left Arm)   Pulse 87   Temp 99.9 F (37.7 C) (Oral)   Resp 18   Ht 5\' 7"  (1.702 m)   Wt 74.4 kg (164 lb 1.6 oz)   SpO2 100%   BMI  25.70 kg/m   HEMODYNAMICS:    VENTILATOR SETTINGS:    INTAKE / OUTPUT: I/O last 3 completed shifts: In: 770 [P.O.:720; IV Piggyback:50] Out: -   PHYSICAL EXAMINATION: General:  Chronically ill appearing young male, NAD  Neuro:  Drowsy but wakes with stimulation, answers questions appropriately but falls asleep when left alone HEENT:  Mm moist, no JVD  Cardiovascular:  s1s2 rrr  Lungs:  resps even non labored on RA  Abdomen:  Round, soft  Musculoskeletal:  Warm and dry, R foot erythema, areas of eschar   LABS:  BMET  Recent Labs Lab 09/14/2016 1500 09/16/16 0842  NA 130* 129*  K 4.1 5.8*  CL 93* 91*  CO2 23 22  BUN 18 35*  CREATININE 4.63* 6.91*  GLUCOSE 220* 211*    Electrolytes  Recent Labs Lab 09/07/2016 1500 09/16/16 0842  CALCIUM 9.2 8.8*    CBC  Recent Labs Lab 09/01/2016 1500 09/16/16 0842  WBC 11.7* 10.8*  HGB 9.8* 9.5*  HCT 29.1* 29.7*  PLT 199 171    Coag's No results for input(s): APTT, INR in the last 168 hours.  Sepsis Markers  Recent Labs Lab 09/21/2016 1505  LATICACIDVEN 1.41    ABG No results for input(s): PHART, PCO2ART, PO2ART  in the last 168 hours.  Liver Enzymes  Recent Labs Lab 09/01/2016 1500  AST 20  ALT 13*  ALKPHOS 103  BILITOT 1.0  ALBUMIN 2.9*    Cardiac Enzymes No results for input(s): TROPONINI, PROBNP in the last 168 hours.  Glucose  Recent Labs Lab 09/16/16 0752 09/16/16 1225 09/16/16 1319  GLUCAP 181* 122* 111*    Imaging Mr Foot Right Wo Contrast  Result Date: 09/16/2016 CLINICAL DATA:  Right foot infection. EXAM: MRI OF THE RIGHT FOREFOOT WITHOUT CONTRAST TECHNIQUE: Multiplanar, multisequence MR imaging of the right foot was performed. No intravenous contrast was administered. COMPARISON:  None. FINDINGS: TENDONS Peroneal: Peroneal longus tendon intact. Peroneal brevis intact. Posteromedial: Posterior tibial tendon intact. Flexor hallucis longus tendon intact. Flexor digitorum longus tendon  intact. Anterior: Tibialis anterior tendon intact. Extensor hallucis longus tendon intact Extensor digitorum longus tendon intact. Achilles:  Intact. Plantar Fascia: Intact. LIGAMENTS Lateral: Anterior talofibular ligament intact. Calcaneofibular ligament intact. Posterior talofibular ligament intact. Anterior and posterior tibiofibular ligaments intact. Medial: Deltoid ligament intact. Spring ligament intact. CARTILAGE Ankle Joint: No joint effusion. Normal ankle mortise. No chondral defect. Subtalar Joints/Sinus Tarsi: Normal subtalar joints. No subtalar joint effusion. Normal sinus tarsi. Bones/soft tissue: Old ununited avulsion fracture at the anterior talofibular ligament insertion. Soft tissue ulcer along the medial aspect of the first proximal phalanx. Minimal marrow edema versus poor fat saturation within the first proximal phalanx. No cortical destruction or periosteal reaction. No fluid collection or hematoma. IMPRESSION: 1. Soft tissue ulcer along the medial aspect of the first proximal phalanx. No evidence of osteomyelitis of the right foot. Electronically Signed   By: Elige Ko   On: 09/16/2016 09:36   Dg Chest Port 1 View  Result Date: 09/16/2016 CLINICAL DATA:  Shortness of breath EXAM: PORTABLE CHEST 1 VIEW COMPARISON:  08/20/2016 and prior radiographs FINDINGS: Cardiomegaly, CABG changes and left basilar scarring again noted. There is no evidence of focal airspace disease, pulmonary edema, suspicious pulmonary nodule/mass, pleural effusion, or pneumothorax. No acute bony abnormalities are identified. IMPRESSION: Cardiomegaly without evidence of acute cardiopulmonary disease. Electronically Signed   By: Harmon Pier M.D.   On: 09/16/2016 14:03   Dg Foot 2 Views Right  Result Date: 08/29/2016 CLINICAL DATA:  36 year old male with diabetes, end-stage renal disease on dialysis. Cellulitis treated with clindamycin. Right foot wound and pain. EXAM: RIGHT FOOT - 2 VIEW COMPARISON:  08/20/2016.  FINDINGS: Extensive calcified peripheral vascular disease. No subcutaneous gas. Small area of soft tissue swelling along the medial aspect of the right great toe, but the underlying right first ray osseous structures appear intact. No acute fracture or dislocation. No cortical osteolysis identified. IMPRESSION: 1. No radiographic evidence of osteomyelitis or acute osseous abnormality. 2. Advanced calcified peripheral vascular disease. Electronically Signed   By: Odessa Fleming M.D.   On: 09/06/2016 16:32     STUDIES:  Xray Right Foot 6/20 > No radiographic evidence of osteomyelitis or acute osseous abnormality. Advanced calcified peripheral vascular disease MRI Right Foot 6/20 > Soft tissue ulcer along the medial aspect of the first proximal phalanx. No evidence of osteomyelitis of the right foot. CXR 6/21 > Cardiomegaly without evidence of acute cardiopulmonary diease   CULTURES: Blood 6/20 >>   ANTIBIOTICS: Vancomycin 6/20 >> Zosyn 6/20 >>  SIGNIFICANT EVENTS: 6/20 > Presented to ED with ? Cellulitis of right foot   LINES/TUBES: PIV  DISCUSSION: 36yo male with DM, ESRD admitted with cellulitis/dry gangrene of the R foot with AMS, bradycardia and hypotension  r/t dilaudid in the setting of ESRD.     ASSESSMENT / PLAN:  Acute Encephalopathy in setting of polypharmacy vs uremia vs sepsis  -Received IV Dilaudid, documented history of sensitivity to pain medication in past. ESRD.  Plan  -Hold all sedating medications  -tx ICU, Narcan gtt > responded well to narcan  -plan for HD now per renal if able > if mental status improves after HD will hold transfer to ICU   Cellulitis of Right Foot  Plan  -Trend WBC and Fever Curve  -Continue to follow culture data -Continue Antibiotics as above   Bradycardia in setting of oversedation vs hyperkalemia - improved.  H/O HTN, CAD s/p CABG Plan  -Cardiac Monitoring  -Hold HTN medications until improvement of BP and HR  -renal arranging HD  for this pm   ESRD HD MWF Plan  -Nephrology following  -Trend BMP  DM Plan -Trend Glucose -SSI    FAMILY  - Updates:  No family at bedside 6/21    Dirk DressKaty Whiteheart, NP 09/16/2016  3:21 PM Pager: [phone] or [phone]  Attending Note:  I have examined patient, reviewed labs, studies and notes. I have discussed the case with Jasper RilingK Whiteheart, and I agree with the data and plans as amended above. 36 year old man with hypertension, end-stage renal disease, peripheral vascular disease, admitted with cellulitis and dry gangrene of right foot wound. He apparently received dilaudid on 6/21 at 7 AM and then experienced progressive lethargy and altered mental status. This progressed to hypotension and a transient bradycardic event. He was given Narcan with improvement in blood pressure and heart rate and transient improvement in his mental status. On my evaluation he wakes easily but he received Narcan about 25 minutes before. He is quickly back to sleep. He is able to answer questions appropriately and follow commands. His skin is a lot most jaundiced mucous membranes are dry. Lungs are clear bilaterally. His heart is regular, 70s. Abdomen benign. His right foot wound has tracking erythema and areas of eschar. The erythema appears to be decreasing inside the markings that were originally traced. He received hemodialysis yesterday. Potassium is elevated on labs today. We will discuss with nephrology and see if it's possible to be dialyzed today. This may help his hyperkalemia and also help him clear the Dilaudid. He otherwise may require a Narcan drip. If so we will move him to the ICU for continuous infusion. Independent critical care time is 45 minutes.   Levy Pupaobert Pantelis Elgersma, MD, PhD 09/16/2016, 4:01 PM [facility] [phone] or if no answer [phone]

## 2016-09-16 NOTE — Significant Event (Signed)
Rapid Response Event Note  Overview: Time Called: 1315 Arrival Time: 1320 Event Type: Respiratory, Cardiac, Neurologic  Initial Focused Assessment: Rn called by central tele for bradycardia rate 30s.  Upon my arrival patient with HR SB 43  BP 63/45  RR 14  O2 sats 78% on RA (questionable pleth) Patient will open eyes to stimulation and mumble a few words, Shallow breathing Pupils pinpoint, Sclera yellow  Cold and clammy Resident at bedside as well. His last HD was yesterday.  He received Dilaudid for pain this am.  Interventions: Placed on NRB 0.4 Narcan given IV 500cc NS bolus Patient alert and oriented, Able to cough and deep breath. SR 60s,  BP 110/68  RR 16  O2 sats 100% on NRB Then about 15 min later became sleepy again 0.4 Narcan given IV Patient again responded to the narcan for about 15 min then became somnolent again. 1435: Dr Delton CoombesByrum at bedside to assess patient  Plan: move to ICU for narcan gtt and HD 90/62 SR 61  RR 24  O2 sat 100% on RA  Transferred to 2M03      Plan of Care (if not transferred):  Event Summary: Name of Physician Notified: Wonda Oldsiccio DO, Resident  at 1315    at          [name], [name]

## 2016-09-16 NOTE — Progress Notes (Signed)
CRITICAL VALUE ALERT  Critical Value:  Lactic acid 5.3 potassium 6.7 troponin 0.42  Date & Time Notied:  09/16/16 verbally notified at time critical labs were called by lab personnel.  Provider Notified: Dr Delton CoombesByrum  Orders Received/Actions taken: MD to bedside to eval pt.

## 2016-09-16 NOTE — Progress Notes (Signed)
  Called to bedside by RN because Mr. [name] was less responsive and HR was <40. Went to see patient. Upon arrival rapid response nurse was at bedside. Patient was on O2 via [place], receiving fluid bolus. HR in 60's, BP 110/60's. Extremities felt cold. Minimally responsive to questions but opened his eyes on command, coughed on command. Stat labs and imaging ordered. Spoke with CCM who will see patient. Pupils appeared miotic, minimally reactive to light. Last received 0.5 mg Dilaudid at 7am. 0.4 mg of Narcan was given and patient became more alert within 1-2 minutes. PRN dilaudid order discontinued. Spoke with patient's father and he states he is extremely sensitive to pain medications and this has happened in the past. Will continue to monitor closely.   Kevin PattyAngela Kourtnee Lahey, DO PGY-1, [facility] 09/16/2016 1:50 PM

## 2016-09-16 NOTE — Consult Note (Signed)
WOC Nurse wound consult note Reason for Consult: right foot venous insufficiency PAD wounds Wound type:full thickness Pressure Injury POA: na Measurement: Entire great toe and dorsal area of digits 2-5 have blackened eschar. Entire Great toe is blue in color with blackened area at base of plantar surface. No drainage, no odor noted.  Wound bed:see above Drainage (amount, consistency, odor) see above Periwound: red with streaks up lower leg area Dressing procedure/placement/frequency: I have provided nurses with orders for To right foot cleanse and dry very well especially between toes.  Place dry 2x2 gauze between all toes and wrap with kerlix, perform daily. Pt to have ABI today and Angiogram tomorrow as [facility]VS is following. We will not follow, but will remain available to this patient, to nursing, and the medical and/or surgical teams.  Please re-consult if we need to assist further.   Barnett HatterMelinda Amberli Ruegg, RN-C, WTA-C Wound Treatment Associate

## 2016-09-16 NOTE — Progress Notes (Signed)
  At bedside monitoring patient. Patient became somnolent again and required second dose of 0.4mg  Narcan with improvement. AOx3. Had BM. Requested to eat lunch. Not requiring any oxygen. About 10-15 minutes later he becoming drowsy. Patient will be transferred to higher level of care, Step down vs ICU if requires narcan drip. Will continue to monitor closely.  Dolores PattyAngela Riccio, DO PGY-1, [facility] 09/16/2016 2:32 PM

## 2016-09-16 NOTE — Progress Notes (Signed)
Dr. Delton CoombesByrum at bedside. Reassessed patient after first dose of sodium bicarb. Order for second dose received.

## 2016-09-16 NOTE — Progress Notes (Signed)
  Family Medicine Teaching Service Daily Progress Note Intern Pager: [phone]  Patient name: [name] Medical record number: [id number] Date of birth: September 10, 1980 Age: 36 y.o. Gender: male  Primary Care Provider: System, Pcp Not In Consultants: VVS, nephro Code Status: full  Pt Overview and Major Events to Date:  6/20- admitted to FPTS  Assessment and Plan: [name] is a 36 y.o. male presenting with cellulitis. PMH is significant for ESRD on dialysis MWF, hypertension, T2DM, GERD, CAD, CABG (2008)   Cellulitis/lymphadenitis/gangrene of R lower extremity- Tmax overnight 99.9, WBC 11.7 on admission. Concern for peripheral vascular disease contributing to patient infection and possibly gangrene vs eschar  -vitals per floor -blood cultures pending -VSS consulted, plan for arteriogram 6/22 -obtain ABIs -continue antibiotics with vanc and zosyn per pharmacy -consult WOC -MRI right lower extremity done, read pending -dilaudid 0.5 mg q2H prn  ESRD- gets HD MWF -nephrology following for HD on Friday -continue home phosphate binder  HTN- on coreg 12.5 mg daily -continue home medication -monitor BP  CAD  - Continue coreg   T2DM- taking 2 units novolog with meals at home. Last A1C 5.5 in 2008. Patient reports noncompliance with insulin as outpatient, hopes to restart treatment. CBG this morning 181. -sensitive SSI -CBG AC/HS -repeat A1C pending  GERD- takes omeprazole daily at home -protonix ordered daily  FEN/GI: renal/carb modified diet, NPO at midnight, PPI Prophylaxis: heparin   Disposition: pending clinical improvement  Subjective:   Doing well. No fevers or chills. Pain is controlled with dilaudid.   Objective: Temp:  [99.5 F (37.5 C)-99.9 F (37.7 C)] 99.9 F (37.7 C) (06/21 0651) Pulse Rate:  [81-89] 87 (06/21 0651) Resp:  [17-18] 18 (06/21 0651) BP: (118-161)/(57-86) 118/75 (06/21 0651) SpO2:  [97 %-100 %] 100 % (06/21 0651) Weight:  [164 lb  1.6 oz (74.4 kg)] 164 lb 1.6 oz (74.4 kg) (06/20 1836) Physical Exam: General: pleasant man laying in bed in NAD Cardiovascular: RRR no MRG Respiratory: CTAB. No increased WOB Abdomen: soft, NTND Extremities: right foot with multiple eschars on heel and between toes, redness extending from dorsal foot to lower shin. Right foot cool compared to left.   Laboratory:  Recent Labs Lab 09/07/2016 1500  WBC 11.7*  HGB 9.8*  HCT 29.1*  PLT 199    Recent Labs Lab 09/12/2016 1500  NA 130*  K 4.1  CL 93*  CO2 23  BUN 18  CREATININE 4.63*  CALCIUM 9.2  PROT 7.0  BILITOT 1.0  ALKPHOS 103  ALT 13*  AST 20  GLUCOSE 220*   LA 1.41  Imaging/Diagnostic Tests: Dg Foot 2 Views Right  Result Date: 08/29/2016 CLINICAL DATA:  36 year old male with diabetes, end-stage renal disease on dialysis. Cellulitis treated with clindamycin. Right foot wound and pain. EXAM: RIGHT FOOT - 2 VIEW COMPARISON:  08/20/2016. FINDINGS: Extensive calcified peripheral vascular disease. No subcutaneous gas. Small area of soft tissue swelling along the medial aspect of the right great toe, but the underlying right first ray osseous structures appear intact. No acute fracture or dislocation. No cortical osteolysis identified. IMPRESSION: 1. No radiographic evidence of osteomyelitis or acute osseous abnormality. 2. Advanced calcified peripheral vascular disease. Electronically Signed   By: Odessa FlemingH  Hall M.D.   On: 09/09/2016 16:32     Tillman SersRiccio, Renada Cronin C, DO 09/16/2016, 8:05 AM PGY-1, [facility] FPTS Intern pager: [phone], text pages welcome

## 2016-09-16 NOTE — Progress Notes (Signed)
    Subjective  -   Resting comfortably in bed   Physical Exam:  Palpable right femoral and popliteal pulses Chronic ischemic changes to right foot with cellulitis       Assessment/Plan:    Asked to evaluate patient by Dr. Delton CoombesByrum due to concerns over foot and lack of palpable pulses.  I evaluated the patient, and his exam is unchanged from Dr. Bosie HelperEarly's evaluation yesterday.  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  He will likely require amputation.  He is scheduled for angiography tomorrow.  I do not feel his exam tonight represents any significant change from yesterday.  No acute intervention is needed currently.  Durene CalBrabham, [place] 09/16/2016 8:18 PM --  Vitals:   09/16/16 1915 09/16/16 1959  BP: 112/64   Pulse: (!) 59   Resp: 17   Temp:  100.2 F (37.9 C)    Intake/Output Summary (Last 24 hours) at 09/16/16 2018 Last data filed at 09/16/16 [id number]  Gross per 24 hour  Intake              770 ml  Output                0 ml  Net              770 ml     Laboratory CBC    Component Value Date/Time   WBC 10.8 (H) 09/16/2016 0842   HGB 9.5 (L) 09/16/2016 0842   HCT 29.7 (L) 09/16/2016 0842   PLT 171 09/16/2016 0842    BMET    Component Value Date/Time   NA 132 (L) 09/16/2016 1530   K 6.7 (HH) 09/16/2016 1530   CL 93 (L) 09/16/2016 1530   CO2 21 (L) 09/16/2016 1530   GLUCOSE 92 09/16/2016 1530   BUN 39 (H) 09/16/2016 1530   CREATININE 7.54 (H) 09/16/2016 1530   CALCIUM 8.9 09/16/2016 1530   GFRNONAA 8 (L) 09/16/2016 1530   GFRAA 9 (L) 09/16/2016 1530    COAG No results found for: INR, PROTIME No results found for: PTT  Antibiotics Anti-infectives    Start     Dose/Rate Route Frequency Ordered Stop   09/16/16 1445  vancomycin (VANCOCIN) 500 mg in sodium chloride 0.9 % 100 mL IVPB     500 mg 100 mL/hr over 60 Minutes Intravenous  Once 09/16/16 1444 09/16/16 1914   09/23/2016 2030  piperacillin-tazobactam (ZOSYN) IVPB 3.375 g     3.375 g 12.5  mL/hr over 240 Minutes Intravenous Every 12 hours 08/27/2016 2012     08/27/2016 1915  piperacillin-tazobactam (ZOSYN) IVPB 3.375 g  Status:  Discontinued     3.375 g 100 mL/hr over 30 Minutes Intravenous  Once 09/06/2016 1907 09/23/2016 1911   09/18/2016 1915  vancomycin (VANCOCIN) IVPB 1000 mg/200 mL premix  Status:  Discontinued    Comments:  Patient received 1 dose of vanc with HD 6/20   1,000 mg 200 mL/hr over 60 Minutes Intravenous  Once 09/23/2016 1907 09/18/2016 1911       V. Charlena CrossWells Day Greb IV, M.D. [facility] Office: [phone] Pager:  [phone]

## 2016-09-16 NOTE — Progress Notes (Signed)
Vital signs stable after second dose of sodium bicarb. Dr. Delton CoombesByrum discussed condition of patient and plan of care with patient's father via telephone. Consent received for line placement.

## 2016-09-16 NOTE — Progress Notes (Signed)
RT stuck for ABGx2 without success.  ABG D/C'd per Dr. Sampson GoonFitzgerald.

## 2016-09-16 NOTE — Progress Notes (Signed)
Post HD Cath Insertion Xray with tip projecting over left superior mediastinum. Connected CVP waveform and results were CVP of 19 with no arterial waveform. Blood gast 7.44/36.9/29. Dr.Alva called and okay use of line.   [name], AGACNP-BC [facility]  Pgr: [phone]  PCCM Pgr: [phone]

## 2016-09-16 NOTE — Progress Notes (Addendum)
Patient transferred to 71M for change in mental status, reported to nurse Cordelia PocheIrika Baynes.

## 2016-09-16 NOTE — Procedures (Signed)
Hemodialysis Catheter Insertion Procedure Note Elby BeckJody E Plucinski [id number] [birth year]  Procedure: Insertion of Hemodialysis Catheter Indications: Hemodialysis  Procedure Details Consent: Risks of procedure as well as the alternatives and risks of each were explained to the (patient/caregiver).  Consent for procedure obtained.  Time Out: Verified patient identification, verified procedure, site/side was marked, verified correct patient position, special equipment/implants available, medications/allergies/relevent history reviewed, required imaging and test results available.  Performed  Maximum sterile technique was used including antiseptics, cap, gloves, gown, hand hygiene, mask and sheet.  Skin prep: Chlorhexidine; local anesthetic administered  A Trialysis HD catheter was placed in the left internal jugular vein using the Seldinger technique.  Evaluation Blood flow good Complications: No apparent complications Patient did tolerate procedure well. Chest X-ray ordered to verify placement.  CXR: pending.  Jovita KussmaulKatalina Eubanks, AG-ACNP [facility]  Pgr: [phone]  PCCM Pgr: [phone]  Alyson ReedyWesam G. Yacoub, M.D. [facility]. Pager: [phone]. After hours pager: [phone].

## 2016-09-16 NOTE — Progress Notes (Signed)
CRITICAL VALUE ALERT  Critical Value:  CBG 47  Date & Time Notied:  09/16/16 1634  Provider Notified: Dr Delton CoombesByrum  Orders Received/Actions taken: 1 amp dextrose per protocol for hypoglycemia

## 2016-09-16 NOTE — Progress Notes (Signed)
eLink Physician-Brief Progress Note Patient Name: Kevin BeckJody E Venable DOB: 1980-05-09 MRN: [id number]   Date of Service  09/16/2016  HPI/Events of Note  Wide complexes - hyperkalemia  eICU Interventions  Calcium x 1 Bicarb x 2 D50/insulin x 1 Start CRRT     Intervention Category Major Interventions: Electrolyte abnormality - evaluation and management  [name] V. 09/16/2016, 8:33 PM

## 2016-09-16 NOTE — Progress Notes (Signed)
Pharmacy Antibiotic Note  [name] is a 36 y.o. male admitted on 02/11/17 with RLE cellulitis.  Pharmacy has been consulted for Vancomycin and Zosyn dosing.  Of note, patient was on Clinda outpt x 2 weeks with initial improvement but now worse (last dose ~ 1 week ago).  Rec'd Vancomycin and Fortaz today at outpt HD center (dose unknown).  [facility]  Random Vanc level this morning was 4916mcg/ml after one dose of 1gm given on 6/20.  He has HD scheduled for Friday - will give Vancomycin 500mg  IV x 1 today.  He is currently afebrile and WBC is trending down 10.8K.  He is also receiving IV Zosyn without noted complications.  Plan: Vancomycin 500mg  IV x 1 tomorrow Re-dose Vanc after HD tomorrow Goal pre-HD level ~ 25. Zosyn 3.375 gm IV q12h (4 hour infusion).  Height: 5\' 7"  (170.2 cm) Weight: 164 lb 1.6 oz (74.4 kg) IBW/kg (Calculated) : 66.1  Temp (24hrs), Avg:99.7 F (37.6 C), Min:99.5 F (37.5 C), Max:99.9 F (37.7 C)   Recent Labs Lab 03-08-17 1500 03-08-17 1505 09/16/16 0842  WBC 11.7*  --  10.8*  CREATININE 4.63*  --  6.91*  LATICACIDVEN  --  1.41  --   VANCORANDOM  --   --  16    Estimated Creatinine Clearance: 13.6 mL/min (A) (by C-G formula based on SCr of 6.91 mg/dL (H)).    No Known Allergies  Antimicrobials this admission: Vanc 6/20 >> Zosyn 6/20 >>  Microbiology results: 6/20 blood cx (at outpt HD center) >>  Nadara MustardNita Yarelli Decelles, PharmD., MS Clinical Pharmacist Pager:  [phone] Thank you for allowing pharmacy to be part of this patients care team. 09/16/2016 2:32 PM

## 2016-09-16 NOTE — Consult Note (Signed)
Referring Provider: No ref. provider found Primary Care Physician:  System, Pcp Not In Primary Nephrologist:  Dr Hyman HopesWebb  Reason for Consultation:  Medical management of ESRD  Anemia and secondary hyperparathyroidism  HPI: [name] is a 36 y.o. male presenting with cellulitis of right foot. He states at the end of April he noticed a cut on the back of his heel. He put Neosporin on and kept it clean, without significant healing noted. He then noticed cracks on  the bottom of his foot developing  and he decided to go to the foot docto. He also began going to wound care where he was told to place iodine on the wounds. He set [place] in [place]The patient states that over the last 4-5 days there is worsening erythema. Patient noted to have slightly erthyema spreading up his leg. On Wednesday the patient was started on Vancomycin and Fortaz.  No past medical history on file.  No past surgical history on file.  Prior to Admission medications   Medication Sig Start Date End Date Taking? Authorizing Provider  AURYXIA 1 GM 210 MG(Fe) tablet Take 210 mg by mouth 3 (three) times daily with meals.  07/15/16  Yes [provider]  cadexomer iodine (IODOSORB) 0.9 % gel Apply 1 application topically daily as needed for wound care. 08/05/16  Yes Stover, Titorya, DPM  carvedilol (COREG) 12.5 MG tablet Take by mouth.   Yes [provider]  gabapentin (NEURONTIN) 300 MG capsule Take 300 mg by mouth at bedtime.  08/30/16  Yes [provider]  lidocaine-prilocaine (EMLA) cream Apply 1 application topically as needed.   Yes [provider]  naproxen sodium (ANAPROX) 220 MG tablet Take 220 mg by mouth as needed (pain).    Yes [provider]  omeprazole (PRILOSEC) 40 MG capsule Take 40 mg by mouth at bedtime.  07/06/16  Yes [provider]  omeprazole (PRILOSEC) 40 MG capsule Take by mouth.   Yes [provider]  insulin lispro  (HUMALOG) 100 UNIT/ML injection Inject 2 Units into the skin 3 (three) times daily before meals.    [provider]    Current Facility-Administered Medications  Medication Dose Route Frequency Provider Last Rate Last Dose  . carvedilol (COREG) tablet 12.5 mg  12.5 mg Oral Daily Dolores PattyRiccio, Angela C, DO   12.5 mg at 09/05/2016 2032  . ferric citrate (AURYXIA) tablet 210 mg  210 mg Oral TID WC Riccio, Angela C, DO   210 mg at 09/12/2016 2031  . gabapentin (NEURONTIN) capsule 300 mg  300 mg Oral QHS Riccio, Angela C, DO   300 mg at 09/13/2016 2108  . heparin injection 5,000 Units  5,000 Units Subcutaneous Q8H Riccio, Angela C, DO      . HYDROmorphone (DILAUDID) injection 0.5 mg  0.5 mg Intravenous Q2H PRN Dolores PattyRiccio, Angela C, DO   0.5 mg at 09/16/16 0654  . insulin aspart (novoLOG) injection 0-9 Units  0-9 Units Subcutaneous TID WC Berton BonMikell, Asiyah Zahra, MD   2 Units at 09/16/16 0803  . pantoprazole (PROTONIX) EC tablet 40 mg  40 mg Oral Daily Riccio, Angela C, DO      . piperacillin-tazobactam (ZOSYN) IVPB 3.375 g  3.375 g Intravenous Q12H Hammons, Kimberly B, RPH 12.5 mL/hr at 09/16/16 0804 3.375 g at 09/16/16 0804    Allergies as of 09/09/2016  . (No Known Allergies)    No family history on file.  Social History   Social  History  . Marital status: Single    Spouse name: N/A  . Number of children: N/A  . Years of education: N/A   Occupational History  . Not on file.   Social History Main Topics  . Smoking status: Never Smoker  . Smokeless tobacco: Never Used  . Alcohol use Not on file  . Drug use: Unknown  . Sexual activity: Not on file   Other Topics Concern  . Not on file   Social History Narrative  . No narrative on file    Review of Systems: Gen: +  fever, chills, sweats, no anorexia, fatigue, weakness, malaise, weight loss, and sleep disorder HEENT: No visual complaints, +  history of Retinopathy. Normal external appearance No Epistaxis or Sore throat. No sinusitis.    CV: Denies chest pain, angina, palpitations, syncope, orthopnea, PND, peripheral edema, and claudication. Resp: Denies dyspnea at rest, dyspnea with exercise, cough, sputum, wheezing, coughing up blood, and pleurisy. GI: Denies vomiting blood, jaundice, and fecal incontinence.   Denies dysphagia or odynophagia. GU : ESRD  MS: Denies joint pain, limitation of movement, and swelling, stiffness, low back pain, extremity pain. Denies muscle weakness, cramps, atrophy.  No use of non steroidal antiinflammatory drugs. Derm:  Ischemic right foot Psych: Denies depression, anxiety, memory loss, suicidal ideation, hallucinations, paranoia, and confusion. Heme: Denies bruising, bleeding, and enlarged lymph nodes. Neuro: No headache.  No diplopia. No dysarthria.  No dysphasia.  No history of CVA.  No Seizures. No paresthesias.  No weakness. Endocrine  DM.  No Thyroid disease.  No Adrenal disease.  Physical Exam: Vital signs in last 24 hours: Temp:  [99.5 F (37.5 C)-99.9 F (37.7 C)] 99.9 F (37.7 C) (06/21 0651) Pulse Rate:  [81-89] 87 (06/21 0651) Resp:  [17-18] 18 (06/21 0651) BP: (118-161)/(57-86) 118/75 (06/21 0651) SpO2:  [97 %-100 %] 100 % (06/21 0651) Weight:  [164 lb 1.6 oz (74.4 kg)] 164 lb 1.6 oz (74.4 kg) (06/20 1836) Last BM Date: 09/13/16 General:   Alert,  Well-developed, well-nourished, pleasant and cooperative in NAD Head:  Normocephalic and atraumatic. Eyes:  Sclera clear, no icterus.   Conjunctiva pink. Ears:  Normal auditory acuity. Nose:  No deformity, discharge,  or lesions. Mouth:  No deformity or lesions, dentition normal. Neck:  Supple; no masses or thyromegaly. JVP not elevated Lungs:  Clear throughout to auscultation.   No wheezes, crackles, or rhonchi. No acute distress. Heart:  Regular rate and rhythm; no murmurs, clicks, rubs,  or gallops. Abdomen:  Soft, nontender and nondistended. No masses, hepatosplenomegaly or hernias noted. Normal bowel sounds, without  guarding, and without rebound.   Msk:  Symmetrical without gross deformities. Normal posture. Pulses:  No carotid, renal, femoral bruits. DP and PT symmetrical and equal Extremity  No edema Neurologic:  Alert and  oriented x4;  grossly normal neurologically. Skin:   fistula in right upper extremity. Right lower extremity reddened with redness tracking up mid-shin. Several ulcers over right foot on posterior heel and inbetween toes.  Cervical Nodes:  No significant cervical adenopathy. Psych:  Alert and cooperative. Normal mood and affect.  Intake/Output from previous day: 06/20 0701 - 06/21 0700 In: 770 [P.O.:720; IV Piggyback:50] Out: -  Intake/Output this shift: No intake/output data recorded.  Lab Results:  Recent Labs  08/27/2016 1500  WBC 11.7*  HGB 9.8*  HCT 29.1*  PLT 199   BMET  Recent Labs  09/13/2016 1500  NA 130*  K 4.1  CL 93*  CO2 23  GLUCOSE 220*  BUN 18  CREATININE 4.63*  CALCIUM 9.2   LFT  Recent Labs  09/12/2016 1500  PROT 7.0  ALBUMIN 2.9*  AST 20  ALT 13*  ALKPHOS 103  BILITOT 1.0   PT/INR No results for input(s): LABPROT, INR in the last 72 hours. Hepatitis Panel No results for input(s): HEPBSAG, HCVAB, HEPAIGM, HEPBIGM in the last 72 hours.  Studies/Results: Dg Foot 2 Views Right  Result Date: 09/18/2016 CLINICAL DATA:  36 year old male with diabetes, end-stage renal disease on dialysis. Cellulitis treated with clindamycin. Right foot wound and pain. EXAM: RIGHT FOOT - 2 VIEW COMPARISON:  08/20/2016. FINDINGS: Extensive calcified peripheral vascular disease. No subcutaneous gas. Small area of soft tissue swelling along the medial aspect of the right great toe, but the underlying right first ray osseous structures appear intact. No acute fracture or dislocation. No cortical osteolysis identified. IMPRESSION: 1. No radiographic evidence of osteomyelitis or acute osseous abnormality. 2. Advanced calcified peripheral vascular disease.  Electronically Signed   By: Odessa Fleming M.D.   On: 09/16/2016 16:32    Assessment/Plan:  ESRD- MWF dialysis  [place]  ANEMIA- continue darbepoietin   MBD- follow calcium and phosphorus  HTN/VOL- clinically euvolemic   ACCESS- AVF no issues  PVD with dry gangrene    LOS: 1 Osmel Dykstra W @TODAY @9 :20 AM

## 2016-09-16 NOTE — Progress Notes (Signed)
Pt to 52M at 1600. Arrived on RA. NS infusing at kvo rate. Pt ashen, responds to voice. BP 132/86 HR 73 RR 22 02 99%. Md Byrum paged to inquire about orders for narcan drip. Orders pending.

## 2016-09-16 NOTE — Progress Notes (Signed)
At 13:00, was called by central telemetry that patient's HR dropped to 48, immediately checked patient and was noted sweaty not as responsive ,opens his eyes, v/s and cbg checked and rapid response nurse called. MD was also notified at this time.

## 2016-09-16 NOTE — Progress Notes (Signed)
  [facility] Progress Note  Patient to go for aortogram with runoff and possible intervention tomorrow as third case for Dr. Arbie CookeyEarly. Patient wondering about HD schedule tomorrow. Primary service to consult nephrology for HD. NPO past midnight. Obtain consent.   Maris BergerKimberly Trinh, PA-C [facility] Office: [phone] Pager: [phone] 09/16/2016 7:51 AM

## 2016-09-17 ENCOUNTER — Encounter (HOSPITAL_COMMUNITY): Payer: Self-pay

## 2016-09-17 ENCOUNTER — Inpatient Hospital Stay (HOSPITAL_COMMUNITY): Admission: EM | Disposition: E | Payer: Self-pay | Source: Home / Self Care | Attending: Family Medicine

## 2016-09-17 ENCOUNTER — Inpatient Hospital Stay (HOSPITAL_COMMUNITY): Payer: Medicare Other

## 2016-09-17 DIAGNOSIS — R6521 Severe sepsis with septic shock: Secondary | ICD-10-CM

## 2016-09-17 DIAGNOSIS — I469 Cardiac arrest, cause unspecified: Secondary | ICD-10-CM

## 2016-09-17 DIAGNOSIS — A419 Sepsis, unspecified organism: Secondary | ICD-10-CM

## 2016-09-17 DIAGNOSIS — J9601 Acute respiratory failure with hypoxia: Secondary | ICD-10-CM

## 2016-09-17 DIAGNOSIS — I739 Peripheral vascular disease, unspecified: Secondary | ICD-10-CM

## 2016-09-17 DIAGNOSIS — I255 Ischemic cardiomyopathy: Secondary | ICD-10-CM

## 2016-09-17 LAB — BASIC METABOLIC PANEL
Anion gap: 21 — ABNORMAL HIGH (ref 5–15)
BUN: 26 mg/dL — AB (ref 6–20)
CALCIUM: 8.3 mg/dL — AB (ref 8.9–10.3)
CO2: 19 mmol/L — AB (ref 22–32)
Chloride: 98 mmol/L — ABNORMAL LOW (ref 101–111)
Creatinine, Ser: 4.84 mg/dL — ABNORMAL HIGH (ref 0.61–1.24)
GFR calc Af Amer: 16 mL/min — ABNORMAL LOW (ref >60)
GFR, EST NON AFRICAN AMERICAN: 14 mL/min — AB (ref >60)
GLUCOSE: 68 mg/dL (ref 65–99)
POTASSIUM: 5.5 mmol/L — AB (ref 3.5–5.1)
Sodium: 138 mmol/L (ref 135–145)

## 2016-09-17 LAB — RENAL FUNCTION PANEL
ALBUMIN: 2.7 g/dL — AB (ref 3.5–5.0)
ALBUMIN: 2.6 g/dL — AB (ref 3.5–5.0)
Anion gap: 23 — ABNORMAL HIGH (ref 5–15)
Anion gap: 23 — ABNORMAL HIGH (ref 5–15)
BUN: 30 mg/dL — AB (ref 6–20)
BUN: 22 mg/dL — AB (ref 6–20)
CALCIUM: 8.9 mg/dL (ref 8.9–10.3)
CO2: 17 mmol/L — ABNORMAL LOW (ref 22–32)
CO2: 13 mmol/L — AB (ref 22–32)
CREATININE: 5.85 mg/dL — AB (ref 0.61–1.24)
Calcium: 8.3 mg/dL — ABNORMAL LOW (ref 8.9–10.3)
Chloride: 96 mmol/L — ABNORMAL LOW (ref 101–111)
Chloride: 99 mmol/L — ABNORMAL LOW (ref 101–111)
Creatinine, Ser: 4.3 mg/dL — ABNORMAL HIGH (ref 0.61–1.24)
GFR calc Af Amer: 13 mL/min — ABNORMAL LOW (ref >60)
GFR calc Af Amer: 19 mL/min — ABNORMAL LOW (ref >60)
GFR calc non Af Amer: 11 mL/min — ABNORMAL LOW (ref >60)
GFR calc non Af Amer: 16 mL/min — ABNORMAL LOW (ref >60)
GLUCOSE: 88 mg/dL (ref 65–99)
Glucose, Bld: 82 mg/dL (ref 65–99)
PHOSPHORUS: 5 mg/dL — AB (ref 2.5–4.6)
PHOSPHORUS: 4.5 mg/dL (ref 2.5–4.6)
POTASSIUM: 5.6 mmol/L — AB (ref 3.5–5.1)
POTASSIUM: 5.5 mmol/L — AB (ref 3.5–5.1)
SODIUM: 135 mmol/L (ref 135–145)
Sodium: 136 mmol/L (ref 135–145)

## 2016-09-17 LAB — BLOOD GAS, ARTERIAL
ACID-BASE DEFICIT: 7.4 mmol/L — AB (ref 0.0–2.0)
BICARBONATE: 17.7 mmol/L — AB (ref 20.0–28.0)
FIO2: 100
LHR: 16 {breaths}/min
O2 Saturation: 99.5 %
PCO2 ART: 36.5 mmHg (ref 32.0–48.0)
PEEP/CPAP: 5 cmH2O
PO2 ART: 511 mmHg — AB (ref 83.0–108.0)
Patient temperature: 98.6
VT: 500 mL
pH, Arterial: 7.307 — ABNORMAL LOW (ref 7.350–7.450)

## 2016-09-17 LAB — GLUCOSE, CAPILLARY
GLUCOSE-CAPILLARY: 74 mg/dL (ref 65–99)
GLUCOSE-CAPILLARY: 82 mg/dL (ref 65–99)
GLUCOSE-CAPILLARY: 120 mg/dL — AB (ref 65–99)
Glucose-Capillary: 89 mg/dL (ref 65–99)

## 2016-09-17 LAB — TROPONIN I
TROPONIN I: 0.77 ng/mL — AB (ref <0.03)
TROPONIN I: 0.76 ng/mL — AB (ref <0.03)
Troponin I: 0.79 ng/mL (ref <0.03)
Troponin I: 0.95 ng/mL (ref <0.03)

## 2016-09-17 LAB — CBC
HCT: 32 % — ABNORMAL LOW (ref 39.0–52.0)
HEMATOCRIT: 32.4 % — AB (ref 39.0–52.0)
Hemoglobin: 10.4 g/dL — ABNORMAL LOW (ref 13.0–17.0)
Hemoglobin: 10.4 g/dL — ABNORMAL LOW (ref 13.0–17.0)
MCH: 28.9 pg (ref 26.0–34.0)
MCH: 29.2 pg (ref 26.0–34.0)
MCHC: 32.5 g/dL (ref 30.0–36.0)
MCHC: 32.1 g/dL (ref 30.0–36.0)
MCV: 88.9 fL (ref 78.0–100.0)
MCV: 91 fL (ref 78.0–100.0)
Platelets: 147 10*3/uL — ABNORMAL LOW (ref 150–400)
Platelets: 126 10*3/uL — ABNORMAL LOW (ref 150–400)
RBC: 3.6 MIL/uL — ABNORMAL LOW (ref 4.22–5.81)
RBC: 3.56 MIL/uL — ABNORMAL LOW (ref 4.22–5.81)
RDW: 13.4 % (ref 11.5–15.5)
RDW: 13.7 % (ref 11.5–15.5)
WBC: 15 10*3/uL — ABNORMAL HIGH (ref 4.0–10.5)
WBC: 14.7 10*3/uL — ABNORMAL HIGH (ref 4.0–10.5)

## 2016-09-17 LAB — PHOSPHORUS: PHOSPHORUS: 4.9 mg/dL — AB (ref 2.5–4.6)

## 2016-09-17 LAB — SURGICAL PCR SCREEN
MRSA, PCR: NEGATIVE
STAPHYLOCOCCUS AUREUS: NEGATIVE

## 2016-09-17 LAB — LACTIC ACID, PLASMA: Lactic Acid, Venous: 11.1 mmol/L (ref 0.5–1.9)

## 2016-09-17 LAB — MAGNESIUM
Magnesium: 2.3 mg/dL (ref 1.7–2.4)
Magnesium: 2.5 mg/dL — ABNORMAL HIGH (ref 1.7–2.4)

## 2016-09-17 LAB — HEMOGLOBIN A1C
Hgb A1c MFr Bld: 8.4 % — ABNORMAL HIGH (ref 4.8–5.6)
Mean Plasma Glucose: 194 mg/dL

## 2016-09-17 LAB — CORTISOL: Cortisol, Plasma: 16 ug/dL

## 2016-09-17 LAB — HIV ANTIBODY (ROUTINE TESTING W REFLEX): HIV SCREEN 4TH GENERATION: NONREACTIVE

## 2016-09-17 SURGERY — ABDOMINAL AORTOGRAM
Anesthesia: LOCAL

## 2016-09-17 MED ORDER — EPINEPHRINE PF 1 MG/10ML IJ SOSY
1.0000 mg | PREFILLED_SYRINGE | Freq: Once | INTRAMUSCULAR | Status: AC
  Administered 2016-09-17: 1 mg via INTRAVENOUS

## 2016-09-17 MED ORDER — ETOMIDATE 2 MG/ML IV SOLN
20.0000 mg | Freq: Once | INTRAVENOUS | Status: AC
  Administered 2016-09-17: 20 mg via INTRAVENOUS

## 2016-09-17 MED ORDER — PANTOPRAZOLE SODIUM 40 MG IV SOLR
40.0000 mg | INTRAVENOUS | Status: DC
  Administered 2016-09-17: 40 mg via INTRAVENOUS
  Filled 2016-09-17: qty 40

## 2016-09-17 MED ORDER — SODIUM BICARBONATE 8.4 % IV SOLN
50.0000 meq | Freq: Once | INTRAVENOUS | Status: AC
  Administered 2016-09-17: 50 meq via INTRAVENOUS

## 2016-09-17 MED ORDER — SODIUM BICARBONATE 8.4 % IV SOLN
INTRAVENOUS | Status: DC
  Administered 2016-09-17: 18:00:00 via INTRAVENOUS
  Filled 2016-09-17 (×2): qty 150

## 2016-09-17 MED ORDER — ROCURONIUM BROMIDE 50 MG/5ML IV SOLN
50.0000 mg | Freq: Once | INTRAVENOUS | Status: AC
  Administered 2016-09-17: 50 mg via INTRAVENOUS

## 2016-09-17 MED ORDER — SODIUM CHLORIDE 0.9 % IV SOLN
0.0300 [IU]/min | INTRAVENOUS | Status: DC
  Administered 2016-09-17: 0.03 [IU]/min via INTRAVENOUS
  Filled 2016-09-17: qty 2

## 2016-09-17 MED ORDER — SODIUM BICARBONATE 8.4 % IV SOLN
INTRAVENOUS | Status: AC
  Administered 2016-09-17: 50 meq via INTRAVENOUS
  Filled 2016-09-17: qty 50

## 2016-09-17 MED ORDER — DOPAMINE-DEXTROSE 3.2-5 MG/ML-% IV SOLN
0.0000 ug/kg/min | INTRAVENOUS | Status: DC
  Administered 2016-09-17: 5 ug/kg/min via INTRAVENOUS
  Filled 2016-09-17: qty 250

## 2016-09-17 MED ORDER — HYDROCORTISONE NA SUCCINATE PF 100 MG IJ SOLR
50.0000 mg | Freq: Three times a day (TID) | INTRAMUSCULAR | Status: DC
  Administered 2016-09-17: 50 mg via INTRAVENOUS
  Filled 2016-09-17: qty 1
  Filled 2016-09-17: qty 2

## 2016-09-17 MED ORDER — NALOXONE HCL 0.4 MG/ML IJ SOLN
INTRAMUSCULAR | Status: AC
  Filled 2016-09-17: qty 2

## 2016-09-18 NOTE — Progress Notes (Signed)
Patient brady to asystole, no code blue called, code status limited to intubation only and patient already intubated. Family was not at bed side but notified earlier of declining status. Patient expired [id number] Dede Turner RN and Annabell Sabalourtney Wofford RN did not auscultate any heart sounds. Family [name] was notified and declined coming back to see him.  ELink MD was notified.

## 2016-09-20 ENCOUNTER — Telehealth: Payer: Self-pay

## 2016-09-20 LAB — CULTURE, BLOOD (ROUTINE X 2)
CULTURE: NO GROWTH
Culture: NO GROWTH
SPECIAL REQUESTS: ADEQUATE
SPECIAL REQUESTS: ADEQUATE

## 2016-09-20 LAB — BLOOD GAS, ARTERIAL
Acid-base deficit: 11.1 mmol/L — ABNORMAL HIGH (ref 0.0–2.0)
Bicarbonate: 13.3 mmol/L — ABNORMAL LOW (ref 20.0–28.0)
FIO2: 40
LHR: 16 {breaths}/min
O2 Saturation: 99 %
PCO2 ART: 24.2 mmHg — AB (ref 32.0–48.0)
PEEP: 5 cmH2O
PH ART: 7.359 (ref 7.350–7.450)
PO2 ART: 190 mmHg — AB (ref 83.0–108.0)
Patient temperature: 98.6
VT: 500 mL

## 2016-09-20 NOTE — Telephone Encounter (Signed)
On 09/20/16 I received a death certificate from (faxed). The death certificate is for cremation. The patient is a patient of Doctor Molli KnockYacoub. The death certificate will be taken to [facility] this pm for signature.  On 09/21/16 I received the death certificate back from Doctor Molli KnockYacoub. I got the death certificate ready and faxed the death certificate to the funeral home per the funeral home request.

## 2016-09-21 LAB — T3: T3 TOTAL: 84 ng/dL (ref 71–180)

## 2016-09-22 MED FILL — Medication: Qty: 1 | Status: AC

## 2016-09-22 NOTE — Discharge Summary (Signed)
NAMSheppard Gaines:  Postel, Darril                 ACCOUNT NO.:  [id number]  MEDICAL RECORD NO.:  [id number]  LOCATION:  2M03C                        FACILITY:  [facility]  PHYSICIAN:  Felipa EvenerWesam Jake Jazzie Trampe, MD  DATE OF BIRTH:  1980/10/07  DATE OF ADMISSION:  2016-12-28 DATE OF DISCHARGE:  09/18/2016                              DISCHARGE SUMMARY   DEATH SUMMARY  PRIMARY DIAGNOSIS/CAUSE OF DEATH:  Diabetic foot infection.  SECONDARY DIAGNOSES:  Acute respiratory failure; right foot cellulitis; acute encephalopathy; sinus bradycardia; metabolic acidosis; pulseless activity cardiac arrest; end-stage renal disease, on hemodialysis and diabetes mellitus.  The patient is a 36 year old male with past medical history significant for poorly-controlled diabetes and end-stage renal disease, presented to hospital with mental status changes, was noted to have right foot cellulitis.  [facility]  Blood pressure continued to deteriorate.  Pulmonary and Critical Care was asked to assume care upon transfer to the ICU.  In the intensive care unit, the patient was started on CRT and pressor support. Subsequently developed altered mental status and cardiac arrest.  The patient was intubated and resuscitated, had multiple bradycardic arrests throughout the rest of the day.  Upon arrival of the father, had a detailed conversation with him, informed him the current condition and informed that the patient would never want this level of care and would prefer comfort, at which point comfort measures were instituted and the patient was extubated and the patient expired shortly thereafter.     Felipa EvenerWesam Jake Marvelyn Bouchillon, MD     WJY/MEDQ  D:  09/22/2016  T:  09/22/2016  Job:  [id number]

## 2016-09-23 ENCOUNTER — Telehealth: Payer: Self-pay

## 2016-09-23 NOTE — Telephone Encounter (Signed)
On 09/23/16 I received a death certificate from [facility] [employer] (original). The death certificate is for cremation. The patient is a patient of Doctor Molli KnockYacoub. [facility] Monday [phone]) for signature.   On 0703/2018 I received the death certificate back from Doctor Molli KnockYacoub. I got the death certificate ready and called the funeral home to let them know the death certificate was mailed to the health dept per the funeral home request.

## 2016-09-26 NOTE — Progress Notes (Signed)
Called to bedside to assess patient for Bradycardia   General appearance: Adult male, moderate distress  Eyes: moist conjunctivae Mouth:  membranes and no mucosal ulcerations Neck: Trachea midline; neck supple, Lungs/chest: non-labored, no wheeze/crackles  CV: Brady, no MRG Abdomen: Soft, non-tender; no masses  Extremities: -edema, right foot necrosis  Skin: warm, dry Psych: non-responsive, does not open eyes   Circulatory Shock  Bradycardia  Plan  -ICU Monitoring  -ABG now  -Start Bicarb gtt (Bicarb 13)  -Continue to titrate Levophed and dopamine gtt for MAP goal >65   ESRD Bicarb 13 Plan  -Continue CVVHD -Trend BMP  -Start Bicarb gtt (Bicarb 13)    Spoke with patient father regarding poor prognosis. After discussion dad decided okay for no CPR/Shock however will continue with ACLS medications if needed.    Jovita KussmaulKatalina Jailyne Chieffo, AGACNP-BC [place]  Pgr: [phone]  PCCM Pgr: [phone]

## 2016-09-26 NOTE — Progress Notes (Signed)
Subjective:  Events noted- pt moved to the ICU- started on pressors and on CRRT- had to change filter once already- on all 4 K fluids  Objective Vital signs in last 24 hours: Vitals:   Dec 20, 2016 0730 Dec 20, 2016 0745 Dec 20, 2016 0800 Dec 20, 2016 0815  BP: 129/77 122/68 111/64 (!) 93/54  Pulse: (!) 59 (!) 56 (!) 54 (!) 50  Resp: 19 19 (!) 21 19  Temp:      TempSrc:      SpO2: 100% (!) 88% 100% 100%  Weight:      Height:       Weight change: 0.065 kg (2.3 oz)  Intake/Output Summary (Last 24 hours) at Dec 20, 2016 0835 Last data filed at Dec 20, 2016 0800  Gross per 24 hour  Intake           929.68 ml  Output              659 ml  Net           270.68 ml   [facility]- last 6/20 4 hours, 450 BFR via AVF on right.  Profile #2, edw 74.  Last hgb 9.8- no esa mentioned, last phos 5.1 on auryxia- PTH 126- no vit D or sensipar  Assessment/ Plan: Pt is a 36 y.o. yo male who was admitted on 09/22/2016 with foot wound/sepsis  Assessment/Plan: 1. Foot wound/sepsis- Per primary and VVS- was supposed to be for arteriogram but may be too unstable for that at present- vanc Laqueta Jean/zosyn- on pressors.  Likely will eventually need amputation 2. ESRD - normally MWF [facility]- via AVF- now req CRRT  3. Anemia- hgb today over 10- has not been getting ESA as OP  4. Secondary hyperparathyroidism- numbers good- auryxia only- no vit D 5. HTN/volume- now req pressors 6. Hypoglycemia- req gluc infusion at 50  7. Hyperkalemia- was down- had disruption of CRRT early this AM- will keep on 4 K baths and look at PM labs    Jerriann Schrom A    Labs: Basic Metabolic Panel:  Recent Labs Lab 09/16/16 1530 09/16/16 1900 Dec 20, 2016 0427  NA 132* 132* 136  K 6.7* 4.6 5.6*  CL 93* 93* 96*  CO2 21* 23 17*  GLUCOSE 92 121* 82  BUN 39* 37* 30*  CREATININE 7.54* 7.03* 5.85*  CALCIUM 8.9 8.7* 8.9  PHOS  --  4.7* 5.0*   Liver Function Tests:  Recent Labs Lab 08/31/2016 1500 09/16/16 1900 Dec 20, 2016 0427  AST 20  --    --   ALT 13*  --   --   ALKPHOS 103  --   --   BILITOT 1.0  --   --   PROT 7.0  --   --   ALBUMIN 2.9* 2.5* 2.7*   No results for input(s): LIPASE, AMYLASE in the last 168 hours. No results for input(s): AMMONIA in the last 168 hours. CBC:  Recent Labs Lab 09/25/2016 1500 09/16/16 0842 09/16/16 1928 Dec 20, 2016 0425  WBC 11.7* 10.8* 15.5* 15.0*  NEUTROABS 9.4*  --   --   --   HGB 9.8* 9.5* 9.8* 10.4*  HCT 29.1* 29.7* 29.9* 32.0*  MCV 86.9 89.2 88.5 88.9  PLT 199 171 144* 147*   Cardiac Enzymes:  Recent Labs Lab 09/16/16 1530 09/16/16 1928 Dec 20, 2016 0425  TROPONINI 0.42* 0.77* 0.76*   CBG:  Recent Labs Lab 09/16/16 1624 09/16/16 1735 09/16/16 1958 09/16/16 2320 Dec 20, 2016 0716  GLUCAP 47* 153* 82 118* 89    Iron Studies: No results  for input(s): IRON, TIBC, TRANSFERRIN, FERRITIN in the last 72 hours. Studies/Results: Mr Foot Right Wo Contrast  Result Date: 09/16/2016 CLINICAL DATA:  Right foot infection. EXAM: MRI OF THE RIGHT FOREFOOT WITHOUT CONTRAST TECHNIQUE: Multiplanar, multisequence MR imaging of the right foot was performed. No intravenous contrast was administered. COMPARISON:  None. FINDINGS: TENDONS Peroneal: Peroneal longus tendon intact. Peroneal brevis intact. Posteromedial: Posterior tibial tendon intact. Flexor hallucis longus tendon intact. Flexor digitorum longus tendon intact. Anterior: Tibialis anterior tendon intact. Extensor hallucis longus tendon intact Extensor digitorum longus tendon intact. Achilles:  Intact. Plantar Fascia: Intact. LIGAMENTS Lateral: Anterior talofibular ligament intact. Calcaneofibular ligament intact. Posterior talofibular ligament intact. Anterior and posterior tibiofibular ligaments intact. Medial: Deltoid ligament intact. Spring ligament intact. CARTILAGE Ankle Joint: No joint effusion. Normal ankle mortise. No chondral defect. Subtalar Joints/Sinus Tarsi: Normal subtalar joints. No subtalar joint effusion. Normal sinus tarsi.  Bones/soft tissue: Old ununited avulsion fracture at the anterior talofibular ligament insertion. Soft tissue ulcer along the medial aspect of the first proximal phalanx. Minimal marrow edema versus poor fat saturation within the first proximal phalanx. No cortical destruction or periosteal reaction. No fluid collection or hematoma. IMPRESSION: 1. Soft tissue ulcer along the medial aspect of the first proximal phalanx. No evidence of osteomyelitis of the right foot. Electronically Signed   By: Elige Ko   On: 09/16/2016 09:36   Dg Chest Port 1 View  Result Date: 09/16/2016 CLINICAL DATA:  Encounter for central line placement EXAM: PORTABLE CHEST 1 VIEW COMPARISON:  Portable exam 1909 hours compared to 1348 hours FINDINGS: External pacing leads project over chest. New LEFT jugular central venous catheter with tip projecting over LEFT superior mediastinum. Enlargement of cardiac silhouette post CABG. Mediastinal contours and pulmonary vascularity normal. Subsegmental atelectasis LEFT base. No infiltrate, pleural effusion or pneumothorax. IMPRESSION: No pneumothorax following central line placement. Enlargement of cardiac silhouette post CABG. Subsegmental atelectasis LEFT base. Electronically Signed   By: Ulyses Southward M.D.   On: 09/16/2016 19:29   Dg Chest Port 1 View  Result Date: 09/16/2016 CLINICAL DATA:  Shortness of breath EXAM: PORTABLE CHEST 1 VIEW COMPARISON:  08/20/2016 and prior radiographs FINDINGS: Cardiomegaly, CABG changes and left basilar scarring again noted. There is no evidence of focal airspace disease, pulmonary edema, suspicious pulmonary nodule/mass, pleural effusion, or pneumothorax. No acute bony abnormalities are identified. IMPRESSION: Cardiomegaly without evidence of acute cardiopulmonary disease. Electronically Signed   By: Harmon Pier M.D.   On: 09/16/2016 14:03   Dg Foot 2 Views Right  Result Date: 08/31/2016 CLINICAL DATA:  36 year old male with diabetes, end-stage renal  disease on dialysis. Cellulitis treated with clindamycin. Right foot wound and pain. EXAM: RIGHT FOOT - 2 VIEW COMPARISON:  08/20/2016. FINDINGS: Extensive calcified peripheral vascular disease. No subcutaneous gas. Small area of soft tissue swelling along the medial aspect of the right great toe, but the underlying right first ray osseous structures appear intact. No acute fracture or dislocation. No cortical osteolysis identified. IMPRESSION: 1. No radiographic evidence of osteomyelitis or acute osseous abnormality. 2. Advanced calcified peripheral vascular disease. Electronically Signed   By: Odessa Fleming M.D.   On: 09/14/2016 16:32   Medications: Infusions: . sodium chloride Stopped (09/16/2016 0400)  . sodium chloride 100 mL (09/25/2016 0700)  . dextrose 50 mL/hr at 09/06/2016 0700  . naLOXone [facility]) adult infusion for OVERDOSE Stopped (09/16/16 1700)  . norepinephrine (LEVOPHED) Adult infusion 8 mcg/min (09/03/2016 0805)  . piperacillin-tazobactam Stopped (09/25/2016 0659)  . dialysis replacement fluid (prismasate) 200  mL/hr at 09/16/16 2046  . dialysis replacement fluid (prismasate) 300 mL/hr at 09/16/16 2046  . dialysate (PRISMASATE) 2,000 mL/hr at 09/18/2016 1610  . sodium chloride    . vancomycin      Scheduled Medications: . chlorhexidine  15 mL Mouth Rinse BID  . ferric citrate  210 mg Oral TID WC  . gabapentin  300 mg Oral QHS  . heparin  5,000 Units Subcutaneous Q8H  . insulin aspart  2-6 Units Subcutaneous Q4H  . mouth rinse  15 mL Mouth Rinse q12n4p  . pantoprazole  40 mg Oral Daily    have reviewed scheduled and prn medications.  Physical Exam: General: minimally responsive this AM Heart: RRR Lungs: moslty clear Abdomen: soft, non tender Extremities: no edema- right foot with wounds on toe/heel- diffuse erythema Dialysis Access: right upper arm AVF- good thrill and bruit    09/15/2016,8:35 AM  LOS: 2 days

## 2016-09-26 NOTE — Progress Notes (Signed)
eLink Physician-Brief Progress Note Patient Name: Elby BeckJody E Droege DOB: 1980-09-27 MRN: [id number]   Date of Service  11/23/2016  HPI/Events of Note  Persistent and worsening shock. Patient on maximum Levophed currently ordered at 40. Post arrest. Currently on CVVHD. Repeat electrolyte panel pending for hyperkalemia.   eICU Interventions  1. Increasing maximum rate of infusion for Levophed 80 2. Ordering vasopressin 0.03 3. Checking serum cortisol 4. Continuing ICU monitoring      Intervention Category Major Interventions: Hypotension - evaluation and management  Lawanda CousinsJennings Nestor 11/23/2016, 4:23 PM

## 2016-09-26 NOTE — Progress Notes (Signed)
Patient ID: [name], male   DOB: [birth year], 36 y.o.   MRN: [id number] Events noted. Patient has suffered an arrest and is now intubated. Discussed with his father present. Explained that with palpable right popliteal pulse is very little chance for limb salvage but had planned arteriogram today to determine his anatomy. This is obviously on hold due to his medical deterioration. Does not appear that this is making him toxic. Had bradycardic arrest. We'll continue to follow. Plan arteriogram if he stabilizes versus primary below-knee amputation. The father asked if I had discussed this with the patient and explained that I had him he was understanding and agreeable to proceed with amputation if required. Following with you.

## 2016-09-26 NOTE — Significant Event (Signed)
Called to bedside for bradycardia with subsequent cardiac arrest.  CPR, Epinephrine, atropine ,bicarb pushed, please see code sheet. Intubated urgently. See intubation note.  General: WNWDWM, poorly responsive but just received NMB  HEENT: MM pink/moist JYN:WGNFAOZPSY:Sedated Neuro: NMB on baird CV: HSR SR PULM: =BBSH HY:QMVHGI:soft, non-tender, bsx4 active  Extremities: rt foot with necrosis Skin: as noted BP (!) 188/99   Pulse 66   Temp 97.4 F (36.3 C) (Oral)   Resp (!) 22   Ht 5\' 7"  (1.702 m)   Wt 164 lb 14.5 oz (74.8 kg)   SpO2 97%   BMI 25.83 kg/m   Recent Labs Lab 09/16/16 1530 09/16/16 1900 08/28/2016 0427  NA 132* 132* 136  K 6.7* 4.6 5.6*  CL 93* 93* 96*  CO2 21* 23 17*  BUN 39* 37* 30*  CREATININE 7.54* 7.03* 5.85*  GLUCOSE 92 121* 82    Recent Labs Lab 09/16/16 0842 09/16/16 1928 09/22/2016 0425  HGB 9.5* 9.8* 10.4*  HCT 29.7* 29.9* 32.0*  WBC 10.8* 15.5* 15.0*  PLT 171 144* 147*    ABG    Component Value Date/Time   PHART 7.426 09/16/2016 1718   PCO2ART 24.4 (L) 09/16/2016 1718   PO2ART 103.0 09/16/2016 1718   HCO3 25.6 09/16/2016 1943   TCO2 27 09/16/2016 1943   ACIDBASEDEF 7.0 (H) 09/16/2016 1718   O2SAT 59.0 09/16/2016 1943   I/P Post asystolic arrest of unknown cause while on CVVH with bradycardia , cpr and ROSC -See vent orders -Pan labs -Check ABG  Dr. Molli KnockYacoub involved in resuscitation.   Brett CanalesSteve Minor ACNP [facility] Pager [phone] till 3 pm If no answer page [phone] 09/03/2016, 10:40 AM  Alyson ReedyWesam G. Keston Seever, M.D. [facility]. Pager: [phone]. After hours pager: [phone].

## 2016-09-26 NOTE — Progress Notes (Signed)
PULMONARY / CRITICAL CARE MEDICINE   Name: [name] MRN: [id number] DOB: Aug 04, 1980    ADMISSION DATE:  09/07/2016 CONSULTATION DATE:  6/21  REFERRING MD:  FPTS   CHIEF COMPLAINT:  AMS  HISTORY OF PRESENT ILLNESS:   36yo male with extensive PMH including HTN, CAD s/p CABG, DM, ESRD who presented 6/20 with worsening cellulitis of an ongoing R foot wound.  Lactate was reassuring and xray without signs of osteo.  He was admitted by FPTS for IV abx, wound care and surgical/vascular input.  On 6/21 am he received dilaudid for R foot pain and subsequently had worsening mental status, bradycardia and hypotension.  Had some improvement with narcan but only temporarily.  PCCM consulted for ICU tx.   SUBJECTIVE:  Off narcan, holding sedation, for arteriogram 6/22  VITAL SIGNS: BP (!) 81/54   Pulse (!) 46   Temp 97.4 F (36.3 C) (Oral)   Resp (!) 24   Ht 5\' 7"  (1.702 m)   Wt 164 lb 14.5 oz (74.8 kg)   SpO2 95%   BMI 25.83 kg/m   HEMODYNAMICS:    VENTILATOR SETTINGS:    INTAKE / OUTPUT: I/O last 3 completed shifts: In: 1624.5 [P.O.:720; I.V.:754.5; IV Piggyback:150] Out: 622 [Other:622]  PHYSICAL EXAMINATION: General:  WNWD HEENT: no jvd/lan WUJ:WJXBJYN Neuro: drowsy but follows commands, off narcan drip CV:HSR RRR PULM: Decreased bs bases WG:NFAO, non-tender, bsx4 active  Extremities: warm/dry, rt lower foot with eschar and tissue necrosis  Skin: WD  LABS:  BMET  Recent Labs Lab 09/16/16 1530 09/16/16 1900 09/19/2016 0427  NA 132* 132* 136  K 6.7* 4.6 5.6*  CL 93* 93* 96*  CO2 21* 23 17*  BUN 39* 37* 30*  CREATININE 7.54* 7.03* 5.85*  GLUCOSE 92 121* 82    Electrolytes  Recent Labs Lab 09/16/16 1530 09/16/16 1900 08/29/2016 0425 09/06/2016 0427  CALCIUM 8.9 8.7*  --  8.9  MG  --   --  2.3  --   PHOS  --  4.7*  --  5.0*    CBC  Recent Labs Lab 09/16/16 0842 09/16/16 1928 09/06/2016 0425  WBC 10.8* 15.5* 15.0*  HGB 9.5* 9.8* 10.4*  HCT 29.7*  29.9* 32.0*  PLT 171 144* 147*    Coag's No results for input(s): APTT, INR in the last 168 hours.  Sepsis Markers  Recent Labs Lab 09/02/2016 1505 09/16/16 1530  LATICACIDVEN 1.41 5.3*    ABG  Recent Labs Lab 09/16/16 1718  PHART 7.426  PCO2ART 24.4*  PO2ART 103.0    Liver Enzymes  Recent Labs Lab 08/31/2016 1500 09/16/16 1900 09/13/2016 0427  AST 20  --   --   ALT 13*  --   --   ALKPHOS 103  --   --   BILITOT 1.0  --   --   ALBUMIN 2.9* 2.5* 2.7*    Cardiac Enzymes  Recent Labs Lab 09/16/16 1530 09/16/16 1928 09/06/2016 0425  TROPONINI 0.42* 0.77* 0.76*    Glucose  Recent Labs Lab 09/16/16 1319 09/16/16 1624 09/16/16 1735 09/16/16 1958 09/16/16 2320 09/16/2016 0716  GLUCAP 111* 47* 153* 82 118* 89    Imaging Dg Chest Port 1 View  Result Date: 09/16/2016 CLINICAL DATA:  Encounter for central line placement EXAM: PORTABLE CHEST 1 VIEW COMPARISON:  Portable exam 1909 hours compared to 1348 hours FINDINGS: External pacing leads project over chest. New LEFT jugular central venous catheter with tip projecting over LEFT superior mediastinum. Enlargement of cardiac silhouette  post CABG. Mediastinal contours and pulmonary vascularity normal. Subsegmental atelectasis LEFT base. No infiltrate, pleural effusion or pneumothorax. IMPRESSION: No pneumothorax following central line placement. Enlargement of cardiac silhouette post CABG. Subsegmental atelectasis LEFT base. Electronically Signed   By: Ulyses Southward M.D.   On: 09/16/2016 19:29   Dg Chest Port 1 View  Result Date: 09/16/2016 CLINICAL DATA:  Shortness of breath EXAM: PORTABLE CHEST 1 VIEW COMPARISON:  08/20/2016 and prior radiographs FINDINGS: Cardiomegaly, CABG changes and left basilar scarring again noted. There is no evidence of focal airspace disease, pulmonary edema, suspicious pulmonary nodule/mass, pleural effusion, or pneumothorax. No acute bony abnormalities are identified. IMPRESSION: Cardiomegaly  without evidence of acute cardiopulmonary disease. Electronically Signed   By: Harmon Pier M.D.   On: 09/16/2016 14:03     STUDIES:  Xray Right Foot 6/20 > No radiographic evidence of osteomyelitis or acute osseous abnormality. Advanced calcified peripheral vascular disease MRI Right Foot 6/20 > Soft tissue ulcer along the medial aspect of the first proximal phalanx. No evidence of osteomyelitis of the right foot. CXR 6/21 > Cardiomegaly without evidence of acute cardiopulmonary diease  6/22 arteriogram planned>>   CULTURES: Blood 6/20 >>   ANTIBIOTICS: Vancomycin 6/20 >> Zosyn 6/20 >>  SIGNIFICANT EVENTS: 6/20 > Presented to ED with ? Cellulitis of right foot  6/22 on pressors and cvvh 6/22 plan for OR vascular study>>  LINES/TUBES: PIV  DISCUSSION: 36yo male with DM, ESRD admitted with cellulitis/dry gangrene of the R foot with AMS, bradycardia and hypotension r/t dilaudid in the setting of ESRD.  On cvh due to hypotension via left IJ HD cath.    ASSESSMENT / PLAN:  Acute Encephalopathy in setting of polypharmacy vs uremia vs sepsis  -Received IV Dilaudid, documented history of sensitivity to pain medication in past. ESRD.  Plan  -Hold all sedating medications  -tx ICU, Narcan gtt > off as 6 am 6/22 CCVH due to hypotensin transfer to ICU   Cellulitis of Right Foot  Plan  -Trend WBC and Fever Curve  -Continue to follow culture data -Continue Antibiotics as above   Bradycardia in setting of oversedation vs hyperkalemia - resolved.  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  Patient was given narcotics on the floor and developed hypotension and AMS.  Transferred to the ICU for narcan drip.  CRRT was started.  While in the ICU the patient suffered a cardiac arrest that I suspect was respiratory driven and was coded and intubated.  Levophed was started for BP support.  One exam, necrotic right leg and coarse BS diffusely.  I reviewed CXR myself, ETT in good position.  Father called and he is to come in to discuss plan of care.  Vascular has been called and arteriogram was planned  today but unsure if that will take place given cardiac arrest, will defer to vascular.  Continue abx for now.  Pressor support.  Full vent support.  The patient is critically ill with multiple organ systems failure and requires high complexity decision making for assessment and support, frequent evaluation and titration of therapies, application of advanced monitoring technologies and extensive interpretation of multiple databases.   Critical Care Time devoted to patient care services described in this note is  120  Minutes. This time reflects time of care of this signee Dr Koren BoundWesam Taleia Gaines. This critical care time does not reflect procedure time, or teaching time or supervisory time of PA/NP/Med student/Med Resident etc but could involve care discussion time.  Alyson ReedyWesam G. Morris Gaines, M.D. [facility]/Critical Care Medicine. Pager: [phone]. After hours pager: [phone].

## 2016-09-26 NOTE — Progress Notes (Signed)
   09/19/2016 1015  Clinical Encounter Type  Visited With Health care provider  Visit Type Code  Spiritual Encounters  Spiritual Needs Emotional  Stress Factors  Patient Stress Factors Not reviewed  Responded to code blue. Pt stabilized. No other needs at this time.

## 2016-09-26 NOTE — Progress Notes (Signed)
Patient noted to have a HR in the 20 with SBP 70's. [facility] called. 1 epi and 1 bicarb given per Dr. Jamison NeighborNestor order. Patient started on dopamine gtt and bicarb gtt. Family called. Patient now a limited code blue- meds only. Patient continued on levo gtt and CRRT.

## 2016-09-26 NOTE — Progress Notes (Signed)
Called back to bedside by Patient Father.   Father discussed that patient would not want to be like this. He told him in the ED to get his son at the bedside to see him and then take the breathing tube out. Discussed in detail goals of care. Decision reached to not escalate care. Will continue with current therapies however if patient starts to decompensate over night will allow patient to pass away. Tomorrow will make decision whether or not to extubate to transition to comfort measures.   Jovita KussmaulKatalina Erienne Spelman, AGACNP-BC [facility]  Pgr: [phone]  PCCM Pgr: [phone]

## 2016-09-26 NOTE — Progress Notes (Signed)
At 1008 patient noted to be asystole on monitor. No pulse palpitated. Code blue called and chest compressions started. A total of 2 epi, 1 bicarb, and 1 atropine administered per Dr. Molli KnockYacoub order. Pulse returned, but bradycardic with HR in 20's. Continued bagging patient and chest compressions. Patient intubated by Devra DoppSteve Minor, NP and left radial ART placed by Dr. Molli KnockYacoub. HR returned to NSR. Patient continued on levophed and CRRT.

## 2016-09-26 NOTE — Progress Notes (Signed)
  FPTS Social Note  Came to see [name], he remains in ICU for CRRT and narcan drip. Appreciate excellent care by ICU team, will resume care once he is transferred out of ICU.  Dolores PattyAngela Alexzandria Massman, DO PGY-1, [facility] 2016/09/06 8:24 AM -intern pager [phone]

## 2016-09-26 NOTE — Procedures (Signed)
CPR Code Note  PEA cardiac arrest, CPR, epix2, bicarb, atropine and intubated.  ROSC established, please see code note for details.  Alyson ReedyWesam G. Aaleeyah Bias, M.D. [facility]/Critical Care Medicine. Pager: [phone]. After hours pager: [phone]

## 2016-09-26 NOTE — Progress Notes (Signed)
Initial Nutrition Assessment  DOCUMENTATION CODES:   Not applicable  INTERVENTION:   If unable to extubate, recommend start TF via OGT:   Vital AF 1.2 at 70 ml/h (1680 ml per day)  Provides 2016 kcal, 126 gm protein, 1362 ml free water daily  NUTRITION DIAGNOSIS:   Inadequate oral intake related to inability to eat as evidenced by NPO status.  GOAL:   Patient will meet greater than or equal to 90% of their needs  MONITOR:   Vent status, Labs, I & O's  REASON FOR ASSESSMENT:   Ventilator    ASSESSMENT:   36 yo male with PMH of HTN, CAD, DM, ESRD who was admitted on 6/20 with cellulitis of R foot wound, requiring IV antibiotics. Developed bradycardia with subsequent cardiac arrest and required emergent intubation on 6/22.  Discussed patient in ICU rounds and with RN today. Patient was just intubated this morning. Receiving CRRT. Volume overload +1 L since admission. No plans to start TF today. OGT in place. May require BKA for R foot wound. Unable to complete Nutrition-Focused physical exam at this time.  MV: 10.9 L/min Temp (24hrs), Avg:98.1 F (36.7 C), Min:95.2 F (35.1 C), Max:100.2 F (37.9 C)  Propofol: none Labs reviewed: potassium 5.5 (H), phosphorus 4.9 (H), magnesium 2.5 (H) Medications reviewed.  Diet Order:  Diet NPO time specified Except for: Sips with Meds  Skin:  Reviewed, no issues  Last BM:  6/18  Height:   Ht Readings from Last 1 Encounters:  09/16/16 5\' 7"  (1.702 m)    Weight:   Wt Readings from Last 1 Encounters:  09/03/2016 164 lb 14.5 oz (74.8 kg)    Ideal Body Weight:  67.3 kg  BMI:  Body mass index is 25.83 kg/m.  Estimated Nutritional Needs:   Kcal:  2020  Protein:  120-140 gm  Fluid:  2 L  EDUCATION NEEDS:   No education needs identified at this time  Joaquin CourtsKimberly Ryeleigh Santore, RD, LDN, CNSC Pager [phone] After Hours Pager [phone]

## 2016-09-26 NOTE — Care Management Note (Signed)
Case Management Note  Patient Details  Name: [name] MRN: [id number] Date of Birth: 05/11/1980  Subjective/Objective:      Pt admitted with right lower leg cellulitis, PEA arrested and intubated              Action/Plan:   PTA on HD.  NO family at bedside   Expected Discharge Date:                  Expected Discharge Plan:     In-House Referral:     Discharge planning Services  CM Consult  Post Acute Care Choice:    Choice offered to:     DME Arranged:    DME Agency:     HH Arranged:    HH Agency:     Status of Service:     If discussed at [employer] Meetings, dates discussed:    Additional Comments:  Cherylann ParrClaxton, Aveena Bari S, RN 08/27/2016, 4:00 PM

## 2016-09-26 NOTE — Progress Notes (Signed)
Father Jorja Loaim Castilleja updated over the phone about patients rhythm change, he is not returning this evening but will continue to receive updates. RN will continue to monitor.

## 2016-09-26 NOTE — Procedures (Signed)
Intubation Procedure Note [name] [id number] 1981/03/11  Procedure: Intubation Indications: Airway protection and maintenance  Procedure Details Consent: Unable to obtain consent because of emergent medical necessity. Time Out: Verified patient identification, verified procedure, site/side was marked, verified correct patient position, special equipment/implants available, medications/allergies/relevent history reviewed, required imaging and test results available.  Performed  Maximum sterile technique was used including gloves and hand hygiene.  MAC and 4    Evaluation Hemodynamic Status: Transient hypotension treated with pressors; O2 sats: stable throughout Patient's Current Condition: stable Complications: No apparent complications Patient did tolerate procedure well. Chest X-ray ordered to verify placement.  CXR: pending.   [name] October 04, 2016

## 2016-09-26 NOTE — Progress Notes (Signed)
RN notified of pt Temp of 95.2 F orally. Warm blankets applied to pt.

## 2016-09-26 NOTE — Progress Notes (Signed)
RN notified of pt temp of 93.2 F orally.

## 2016-09-26 NOTE — Procedures (Signed)
Intubation Procedure Note MANUAL NAVARRA [id number] 06-21-1980  Procedure: Intubation Indications: Airway protection and maintenance  Procedure Details Consent: Unable to obtain consent because of emergent medical necessity. Time Out: Verified patient identification, verified procedure, site/side was marked, verified correct patient position, special equipment/implants available, medications/allergies/relevent history reviewed, required imaging and test results available.  Performed  MAC and 3 Medications:  Fentanyl  Etomidate 20 mg Versed NMB rocuronium 50 mg   Evaluation Hemodynamic Status: O2 sats 100 % after bmv Patient's Current Condition: unstable Complications: No apparent complications Patient did tolerate procedure well. Chest X-ray ordered to verify placement.  CXR: pending.   Richardson Landry Minor ACNP [facility] PCCM Pager [phone] till 3 pm If no answer page [phone] October 08, 2016, 11:01 AM  Rush Farmer, M.D. [facility] Pulmonary/Critical Care Medicine. Pager: [phone]. After hours pager: [phone].

## 2016-09-26 NOTE — Progress Notes (Signed)
eLink Physician-Brief Progress Note Patient Name: Kevin BeckJody E Gaines DOB: Jan 29, 1981 MRN: [id number]   Date of Service  08/27/2016  HPI/Events of Note  Shock worsening and patient developing bradycardia. Electrolyte panel and serum cortisol pending. Heart rate and blood pressure responded to bolus bicarbonate &  IV push epinephrine.   eICU Interventions  1. Starting dopamine infusion to maintain heart rate and mean arterial pressure 2. Initiating empiric Solu-Cortef 50 mg IV every 8 hours 3. Intensivist NP to evaluate patient and update family regarding tenuous medical status & high probability of cardiac arrest      Intervention Category Major Interventions: Shock - evaluation and management  Lawanda CousinsJennings Chamia Schmutz 09/16/2016, 5:01 PM

## 2016-09-26 NOTE — Procedures (Signed)
Arterial Catheter Insertion Procedure Note Kevin BeckJody E Gaines [id number] 1980/07/30  Procedure: Insertion of Arterial Catheter  Indications: Blood pressure monitoring and Frequent blood sampling  Procedure Details Consent: Unable to obtain consent because of emergent medical necessity. Time Out: Verified patient identification, verified procedure, site/side was marked, verified correct patient position, special equipment/implants available, medications/allergies/relevent history reviewed, required imaging and test results available.  Performed  Maximum sterile technique was used including antiseptics, cap, gloves, gown, hand hygiene, mask and sheet. Skin prep: Chlorhexidine; local anesthetic administered 20 gauge catheter was inserted into left radial artery using the Seldinger technique.  Evaluation Blood flow good; BP tracing good. Complications: No apparent complications.   Kevin BoundYACOUB,[name] 09/22/2016

## 2016-09-26 DEATH — deceased

## 2016-10-01 MED FILL — Medication: Qty: 1 | Status: AC

## 2016-10-14 ENCOUNTER — Encounter: Payer: Medicare Other | Admitting: Vascular Surgery

## 2016-10-14 ENCOUNTER — Encounter (HOSPITAL_COMMUNITY): Payer: Medicare Other

## 2016-10-22 ENCOUNTER — Encounter (HOSPITAL_COMMUNITY): Payer: Medicare Other

## 2016-10-22 ENCOUNTER — Encounter: Payer: Medicare Other | Admitting: Vascular Surgery
